# Patient Record
Sex: Female | Born: 1935 | Race: White | Hispanic: No | Smoking: Current every day smoker
Health system: Southern US, Community
[De-identification: ages and names within clinical notes are randomized; demographics above are authoritative.]

## PROBLEM LIST (undated history)

## (undated) DIAGNOSIS — E079 Disorder of thyroid, unspecified: Secondary | ICD-10-CM

## (undated) DIAGNOSIS — J449 Chronic obstructive pulmonary disease, unspecified: Secondary | ICD-10-CM

## (undated) DIAGNOSIS — J40 Bronchitis, not specified as acute or chronic: Secondary | ICD-10-CM

## (undated) DIAGNOSIS — R159 Full incontinence of feces: Secondary | ICD-10-CM

## (undated) DIAGNOSIS — W19XXXA Unspecified fall, initial encounter: Secondary | ICD-10-CM

## (undated) DIAGNOSIS — R5383 Other fatigue: Secondary | ICD-10-CM

## (undated) DIAGNOSIS — F419 Anxiety disorder, unspecified: Secondary | ICD-10-CM

## (undated) DIAGNOSIS — I10 Essential (primary) hypertension: Secondary | ICD-10-CM

## (undated) HISTORY — PX: VARICOSE VEIN SURGERY: SHX832

## (undated) HISTORY — DX: Chronic obstructive pulmonary disease, unspecified: J44.9

## (undated) HISTORY — DX: Full incontinence of feces: R15.9

## (undated) HISTORY — DX: Bronchitis, not specified as acute or chronic: J40

## (undated) HISTORY — DX: Unspecified fall, initial encounter: W19.XXXA

## (undated) HISTORY — PX: CHOLECYSTECTOMY: SHX55

## (undated) HISTORY — PX: VAGINAL HYSTERECTOMY: SUR661

## (undated) HISTORY — PX: FOOT SURGERY: SHX648

## (undated) HISTORY — PX: APPENDECTOMY: SHX54

## (undated) HISTORY — DX: Other fatigue: R53.83

## (undated) HISTORY — PX: DILATION AND CURETTAGE OF UTERUS: SHX78

## (undated) HISTORY — DX: Anxiety disorder, unspecified: F41.9

## (undated) HISTORY — DX: Disorder of thyroid, unspecified: E07.9

## (undated) HISTORY — DX: Essential (primary) hypertension: I10

---

## 2005-02-18 ENCOUNTER — Ambulatory Visit: Payer: Self-pay | Admitting: Family Medicine

## 2005-05-27 ENCOUNTER — Ambulatory Visit: Payer: Self-pay | Admitting: Gastroenterology

## 2006-02-19 ENCOUNTER — Ambulatory Visit: Payer: Self-pay | Admitting: Family Medicine

## 2006-08-17 ENCOUNTER — Ambulatory Visit: Payer: Self-pay | Admitting: Unknown Physician Specialty

## 2006-08-17 ENCOUNTER — Other Ambulatory Visit: Payer: Self-pay

## 2006-08-20 ENCOUNTER — Inpatient Hospital Stay: Payer: Self-pay | Admitting: Unknown Physician Specialty

## 2007-02-20 ENCOUNTER — Emergency Department: Payer: Self-pay

## 2007-04-29 ENCOUNTER — Ambulatory Visit: Payer: Self-pay | Admitting: Family Medicine

## 2008-03-14 ENCOUNTER — Inpatient Hospital Stay: Payer: Self-pay | Admitting: Internal Medicine

## 2008-03-26 ENCOUNTER — Ambulatory Visit: Payer: Self-pay | Admitting: Family Medicine

## 2008-05-09 ENCOUNTER — Ambulatory Visit: Payer: Self-pay | Admitting: Family Medicine

## 2008-06-21 ENCOUNTER — Ambulatory Visit: Payer: Self-pay | Admitting: Ophthalmology

## 2008-07-04 ENCOUNTER — Ambulatory Visit: Payer: Self-pay | Admitting: Ophthalmology

## 2008-08-24 ENCOUNTER — Ambulatory Visit: Payer: Self-pay | Admitting: Ophthalmology

## 2008-09-05 ENCOUNTER — Ambulatory Visit: Payer: Self-pay | Admitting: Ophthalmology

## 2008-12-15 ENCOUNTER — Emergency Department: Payer: Self-pay | Admitting: Emergency Medicine

## 2009-02-09 ENCOUNTER — Inpatient Hospital Stay: Payer: Self-pay | Admitting: Internal Medicine

## 2009-03-27 ENCOUNTER — Ambulatory Visit: Payer: Self-pay | Admitting: Internal Medicine

## 2009-04-17 ENCOUNTER — Ambulatory Visit: Payer: Self-pay | Admitting: Unknown Physician Specialty

## 2009-04-19 ENCOUNTER — Emergency Department: Payer: Self-pay | Admitting: Emergency Medicine

## 2009-06-12 ENCOUNTER — Inpatient Hospital Stay: Payer: Self-pay | Admitting: Internal Medicine

## 2009-08-09 ENCOUNTER — Inpatient Hospital Stay: Payer: Self-pay | Admitting: Internal Medicine

## 2009-10-08 ENCOUNTER — Emergency Department: Payer: Self-pay | Admitting: Internal Medicine

## 2010-05-05 ENCOUNTER — Emergency Department: Payer: Self-pay | Admitting: Internal Medicine

## 2010-10-04 ENCOUNTER — Ambulatory Visit: Payer: Self-pay | Admitting: Internal Medicine

## 2010-10-08 ENCOUNTER — Ambulatory Visit: Payer: Self-pay | Admitting: Internal Medicine

## 2010-10-11 ENCOUNTER — Ambulatory Visit: Payer: Self-pay | Admitting: Internal Medicine

## 2010-10-12 ENCOUNTER — Ambulatory Visit: Payer: Self-pay | Admitting: Internal Medicine

## 2010-11-10 ENCOUNTER — Ambulatory Visit: Payer: Self-pay | Admitting: Internal Medicine

## 2011-01-02 ENCOUNTER — Other Ambulatory Visit: Payer: Self-pay | Admitting: *Deleted

## 2011-01-02 MED ORDER — VITAMIN D 1000 UNITS PO TABS: 1000.0000 [IU] | ORAL_TABLET | Freq: Every day | ORAL | Status: DC

## 2011-01-14 ENCOUNTER — Emergency Department: Payer: Self-pay | Admitting: Emergency Medicine

## 2011-01-15 ENCOUNTER — Telehealth: Payer: Self-pay | Admitting: Internal Medicine

## 2011-01-15 NOTE — Telephone Encounter (Signed)
It is fine for [place] to set up [facility].

## 2011-01-15 NOTE — Telephone Encounter (Signed)
Ms. Brisbon is coming in tomorrow.

## 2011-01-15 NOTE — Telephone Encounter (Signed)
Patient's daughter called and stated that Mrs. Bickhart fell and broke her shoulder and would like to have a nurse come out 2-3 times a week because she can't do anything for herself.  She would like [facility].  Please advsie.

## 2011-01-16 ENCOUNTER — Encounter: Payer: Self-pay | Admitting: Internal Medicine

## 2011-01-16 ENCOUNTER — Emergency Department (HOSPITAL_COMMUNITY)
Admission: EM | Admit: 2011-01-16 | Discharge: 2011-01-16 | Discharge status: Against Medical Advice | Payer: Medicare Other | Attending: Emergency Medicine | Admitting: Emergency Medicine

## 2011-01-16 ENCOUNTER — Ambulatory Visit (INDEPENDENT_AMBULATORY_CARE_PROVIDER_SITE_OTHER): Payer: BC Managed Care – PPO | Admitting: Internal Medicine

## 2011-01-16 ENCOUNTER — Emergency Department: Payer: Self-pay | Admitting: Emergency Medicine

## 2011-01-16 VITALS — BP 145/70 | HR 92 | Temp 98.4°F | Resp 20 | Ht 62.0 in | Wt 114.0 lb

## 2011-01-16 DIAGNOSIS — J4489 Other specified chronic obstructive pulmonary disease: Secondary | ICD-10-CM

## 2011-01-16 DIAGNOSIS — M79609 Pain in unspecified limb: Secondary | ICD-10-CM

## 2011-01-16 DIAGNOSIS — S42309A Unspecified fracture of shaft of humerus, unspecified arm, initial encounter for closed fracture: Secondary | ICD-10-CM

## 2011-01-16 DIAGNOSIS — T148XXA Other injury of unspecified body region, initial encounter: Secondary | ICD-10-CM

## 2011-01-16 DIAGNOSIS — M79602 Pain in left arm: Secondary | ICD-10-CM

## 2011-01-16 DIAGNOSIS — J449 Chronic obstructive pulmonary disease, unspecified: Secondary | ICD-10-CM | POA: Insufficient documentation

## 2011-01-16 DIAGNOSIS — IMO0002 Reserved for concepts with insufficient information to code with codable children: Secondary | ICD-10-CM

## 2011-01-16 DIAGNOSIS — Z0389 Encounter for observation for other suspected diseases and conditions ruled out: Secondary | ICD-10-CM | POA: Insufficient documentation

## 2011-01-16 NOTE — Progress Notes (Signed)
Subjective:    Patient ID: Michele Hayes, female    DOB: 1925/06/24, 77 y.o.   MRN: [id number]  HPI Ms. Timberman is an 77 year old female with a history of COPD and osteoporosis who presents for an acute visit after a recent fall 2 days ago. She reportedly fell while walking into a restaurant. She fell on her left upper arm. She was evaluated in the emergency department and noted to have a fracture of her left humerus. She also had extensive skin tearing on the left upper arm. She was ultimately discharged home from the ER with plans for followup with orthopedics tomorrow. Her daughters report that he has been unable to change her gauze bandage because of severe left upper arm pain.  Outpatient Encounter Prescriptions as of 01/16/2011  Medication Sig Dispense Refill  . Albuterol Sulfate (VENTOLIN HFA IN) Inhale 2 puffs into the lungs 2 (two) times daily.        Marland Kitchen aspirin 81 MG tablet Take 81 mg by mouth daily.        . cefadroxil (DURICEF) 500 MG capsule       . cholecalciferol (VITAMIN D) 1000 UNITS tablet Take 1 tablet (1,000 Units total) by mouth daily.  30 tablet  3  . diltiazem (CARDIZEM CD) 120 MG 24 hr capsule       . FERROUS SULFATE PO Take 1 tablet by mouth daily.        Marland Kitchen levothyroxine (SYNTHROID, LEVOTHROID) 50 MCG tablet       . oxyCODONE-acetaminophen (PERCOCET) 5-325 MG per tablet       . PARoxetine (PAXIL) 20 MG tablet       . PERFOROMIST 20 MCG/2ML nebulizer solution       . PULMICORT 1 MG/2ML nebulizer solution         Review of Systems  Respiratory: Positive for shortness of breath (chronic).   Cardiovascular: Negative for chest pain.  Musculoskeletal: Positive for myalgias, joint swelling and arthralgias.  Skin: Positive for wound.   BP 145/70  Pulse 92  Temp(Src) 98.4 F (36.9 C) (Oral)  Resp 20  Ht 5\' 2"  (1.575 m)  Wt 114 lb (51.71 kg)  BMI 20.85 kg/m2  SpO2 93%     Objective:   Physical Exam  Constitutional: She is oriented to person, place, and time. She appears  well-developed and well-nourished. No distress.  HENT:  Head: Normocephalic and atraumatic.  Right Ear: External ear normal.  Left Ear: External ear normal.  Nose: Nose normal.  Mouth/Throat: Oropharynx is clear and moist.  Eyes: EOM are normal. Pupils are equal, round, and reactive to light.  Neck: Normal range of motion.  Pulmonary/Chest: Effort normal.  Musculoskeletal:       Left shoulder: She exhibits decreased range of motion, tenderness, bony tenderness, swelling, deformity, pain and decreased strength.       Arms: Neurological: She is alert and oriented to person, place, and time.  Skin: Skin is warm and dry. No rash noted. She is not diaphoretic. There is erythema.  Psychiatric: She has a normal mood and affect. Her behavior is normal. Judgment and thought content normal.          Assessment & Plan:  1. Left humerus fracture - patient has a fracture of her left humerus after a fall from standing position. She was evaluated in the emergency department and given a sling with plans for orthopedic followup tomorrow. She has been having severe pain which is not alleviated with Percocet. Her daughters have  been unable to change the bandage over her skin tear on the left upper arm. Today I am unable to remove the bandage because of her severe pain. We will plan to send her to the [facility] emergency department. I have spoken to the triage nurse there. She will likely need admission for IV narcotics for pain control. She will also need orthopedic eval and wound consult for the extensive skin tear on her left upper arm.

## 2011-01-17 ENCOUNTER — Telehealth: Payer: Self-pay | Admitting: Internal Medicine

## 2011-01-17 NOTE — Telephone Encounter (Signed)
Daughter wants [facility] to come in 1 a day to help with her mom

## 2011-01-17 NOTE — Telephone Encounter (Signed)
I did call ahead to the ED.  Did she follow up with orthopedics today?  Yes, [facility] help with bandage change.

## 2011-01-17 NOTE — Telephone Encounter (Signed)
I spoke with Lennox Laity patients daughter and she stated that they went to [facility] and wait was too long so they left and went to [facility] and they changed her bandage.  She wants to know if we can get [facility] to come out once a  day

## 2011-02-20 ENCOUNTER — Other Ambulatory Visit: Payer: Self-pay | Admitting: *Deleted

## 2011-02-20 MED ORDER — PAROXETINE HCL 20 MG PO TABS: 20.0000 mg | ORAL_TABLET | ORAL | Status: DC

## 2011-02-20 MED ORDER — LEVOTHYROXINE SODIUM 50 MCG PO TABS: 50.0000 ug | ORAL_TABLET | Freq: Every day | ORAL | Status: DC

## 2011-04-01 ENCOUNTER — Telehealth: Payer: Self-pay | Admitting: *Deleted

## 2011-04-01 MED ORDER — DILTIAZEM HCL ER COATED BEADS 120 MG PO CP24: 120.0000 mg | ORAL_CAPSULE | Freq: Every day | ORAL | Status: DC

## 2011-04-01 NOTE — Telephone Encounter (Signed)
Done

## 2011-04-08 ENCOUNTER — Inpatient Hospital Stay: Payer: Self-pay | Admitting: *Deleted

## 2011-04-09 DIAGNOSIS — R000 Tachycardia, unspecified: Secondary | ICD-10-CM

## 2011-04-14 ENCOUNTER — Telehealth: Payer: Self-pay | Admitting: *Deleted

## 2011-04-14 NOTE — Telephone Encounter (Signed)
Keri w/[facility] called - pt was d/c'd from hospital w/no orders to resume home health services. She is requesting fax form to resume care. Form will be faxed in today to resume home health services.

## 2011-04-17 ENCOUNTER — Ambulatory Visit (INDEPENDENT_AMBULATORY_CARE_PROVIDER_SITE_OTHER): Payer: Medicare Other | Admitting: Internal Medicine

## 2011-04-17 ENCOUNTER — Encounter: Payer: Self-pay | Admitting: Internal Medicine

## 2011-04-17 VITALS — BP 100/40 | HR 62 | Temp 97.8°F | Wt 111.0 lb

## 2011-04-17 DIAGNOSIS — R269 Unspecified abnormalities of gait and mobility: Secondary | ICD-10-CM

## 2011-04-17 DIAGNOSIS — J449 Chronic obstructive pulmonary disease, unspecified: Secondary | ICD-10-CM

## 2011-04-17 DIAGNOSIS — I509 Heart failure, unspecified: Secondary | ICD-10-CM

## 2011-04-17 DIAGNOSIS — R2681 Unsteadiness on feet: Secondary | ICD-10-CM

## 2011-04-17 NOTE — Progress Notes (Signed)
Subjective:    Patient ID: Michele Hayes, female    DOB: 12/20/1925, 77 y.o.   MRN: [id number]  HPI 77 year old female with a history of COPD presents for followup after recent hospitalization for pneumonia. She reports that she is feeling well. She reports that she has completed both her antibiotics and her steroids. She has some mild shortness of breath on exertion and chronic cough. However, she reports she is feeling much better overall.  She notes that she was evaluated in the hospital by her cardiologist and found to have some weakening of her heart muscle. She notes that she is scheduled for a stress test. She notes that she is unable to perform a treadmill stress test because of her gait instability. She notes numerous recent falls at home including a fall which resulted in fracture of her left humerus.  Outpatient Encounter Prescriptions as of 04/17/2011  Medication Sig Dispense Refill  . Albuterol Sulfate (VENTOLIN HFA IN) Inhale 2 puffs into the lungs 2 (two) times daily.       Marland Kitchen aspirin 81 MG tablet Take 81 mg by mouth daily.        . cholecalciferol (VITAMIN D) 1000 UNITS tablet Take 1 tablet (1,000 Units total) by mouth daily.  30 tablet  3  . diltiazem (CARDIZEM CD) 120 MG 24 hr capsule Take 180 mg by mouth daily.        Marland Kitchen FERROUS SULFATE PO Take 1 tablet by mouth daily.        Marland Kitchen levothyroxine (SYNTHROID, LEVOTHROID) 50 MCG tablet Take 1 tablet (50 mcg total) by mouth daily.  30 tablet  6  . lisinopril (PRINIVIL,ZESTRIL) 5 MG tablet Take 5 mg by mouth daily.        Marland Kitchen PARoxetine (PAXIL) 20 MG tablet Take 1 tablet (20 mg total) by mouth every morning.  30 tablet  4  . PERFOROMIST 20 MCG/2ML nebulizer solution 20 mcg 2 (two) times daily.       Marland Kitchen PULMICORT 1 MG/2ML nebulizer solution Take 1 mg by nebulization 2 (two) times daily as needed.         Review of Systems  Constitutional: Negative for fever, chills, appetite change, fatigue and unexpected weight change.  HENT: Negative for  congestion and neck pain.   Eyes: Negative for visual disturbance.  Respiratory: Positive for cough and shortness of breath. Negative for wheezing and stridor.   Cardiovascular: Negative for chest pain, palpitations and leg swelling.  Gastrointestinal: Negative for abdominal pain, diarrhea, constipation and abdominal distention.  Genitourinary: Negative for dysuria and flank pain.  Musculoskeletal: Positive for myalgias, arthralgias and gait problem.  Skin: Negative for color change and rash.  Neurological: Negative for dizziness and headaches.  Hematological: Negative for adenopathy. Does not bruise/bleed easily.  Psychiatric/Behavioral: Negative for suicidal ideas, sleep disturbance and dysphoric mood. The patient is not nervous/anxious.    BP 100/40  Pulse 62  Temp(Src) 97.8 F (36.6 C) (Oral)  Wt 111 lb (50.349 kg)  SpO2 90%     Objective:   Physical Exam  Constitutional: She is oriented to person, place, and time. She appears well-developed and well-nourished. No distress.  HENT:  Head: Normocephalic and atraumatic.  Right Ear: External ear normal.  Left Ear: External ear normal.  Nose: Nose normal.  Mouth/Throat: Oropharynx is clear and moist. No oropharyngeal exudate.  Eyes: Conjunctivae are normal. Pupils are equal, round, and reactive to light. Right eye exhibits no discharge. Left eye exhibits no discharge. No scleral icterus.  Neck: Normal range of motion. Neck supple. No tracheal deviation present. No thyromegaly present.  Cardiovascular: Normal rate, regular rhythm, normal heart sounds and intact distal pulses.  Exam reveals no gallop and no friction rub.   No murmur heard. Pulmonary/Chest: Effort normal and breath sounds normal. No accessory muscle usage. Not tachypneic. No respiratory distress. She has no decreased breath sounds. She has no wheezes. She has no rhonchi. She has no rales. She exhibits no tenderness.  Musculoskeletal: Normal range of motion. She exhibits  no edema and no tenderness.  Lymphadenopathy:    She has no cervical adenopathy.  Neurological: She is alert and oriented to person, place, and time. No cranial nerve deficit. She exhibits normal muscle tone. Coordination normal.  Skin: Skin is warm and dry. No rash noted. She is not diaphoretic. No erythema. No pallor.     Psychiatric: She has a normal mood and affect. Her behavior is normal. Judgment and thought content normal.          Assessment & Plan:  1. COPD -her lung exam is the best I've heard it in the recent past. She has good air movement and no rhonchi noted. Will continue to monitor. Will continue her current medications including bronchodilators and inhaled steroids. She will return to clinic in one month.  2. Congestive heart failure - will get records on recent hospitalization and cardiac echo. Will discuss with her cardiologist using a dobutamine stress test as opposed to treadmill test.  3. Gait instability -will prescription today for a walker to help prevent falls in the home. Patient will pick this up at her pharmacy.

## 2011-04-17 NOTE — Patient Instructions (Signed)
Vanicream lotion

## 2011-04-18 ENCOUNTER — Other Ambulatory Visit: Payer: Self-pay | Admitting: *Deleted

## 2011-04-18 MED ORDER — ROLLER WALKER MISC: Status: DC

## 2011-04-25 ENCOUNTER — Telehealth: Payer: Self-pay | Admitting: *Deleted

## 2011-04-25 NOTE — Telephone Encounter (Signed)
Pharm faxed RF request for potassium 10 meq 1 qd and furosemide 20 mg 1 qd. OK for rf? I do not see on med list.

## 2011-04-25 NOTE — Telephone Encounter (Signed)
She hasn't used this recently. We can refill, though, she uses when having edema.

## 2011-04-28 MED ORDER — FUROSEMIDE 20 MG PO TABS: 20.0000 mg | ORAL_TABLET | Freq: Every day | ORAL | Status: DC | PRN

## 2011-04-28 MED ORDER — POTASSIUM CHLORIDE ER 10 MEQ PO TBCR: 10.0000 meq | EXTENDED_RELEASE_TABLET | Freq: Every day | ORAL | Status: DC | PRN

## 2011-04-28 NOTE — Telephone Encounter (Signed)
RX sent in

## 2011-05-12 ENCOUNTER — Other Ambulatory Visit: Payer: Self-pay | Admitting: *Deleted

## 2011-05-12 MED ORDER — VITAMIN D 1000 UNITS PO TABS: 1000.0000 [IU] | ORAL_TABLET | Freq: Every day | ORAL | Status: DC

## 2011-05-13 LAB — CK TOTAL AND CKMB (NOT AT ARMC)
CK, Total: 33 U/L (ref 21–215)
CK-MB: 1.1 ng/mL (ref 0.5–3.6)

## 2011-05-13 LAB — COMPREHENSIVE METABOLIC PANEL
Albumin: 3.2 g/dL — ABNORMAL LOW (ref 3.4–5.0)
Alkaline Phosphatase: 92 U/L (ref 50–136)
Anion Gap: 8 (ref 7–16)
BUN: 20 mg/dL — ABNORMAL HIGH (ref 7–18)
Bilirubin,Total: 0.3 mg/dL (ref 0.2–1.0)
Creatinine: 0.7 mg/dL (ref 0.60–1.30)
EGFR (Non-African Amer.): >60
Glucose: 140 mg/dL — ABNORMAL HIGH (ref 65–99)
Osmolality: 266 (ref 275–301)
SGOT(AST): 22 U/L (ref 15–37)
Sodium: 130 mmol/L — ABNORMAL LOW (ref 136–145)
Total Protein: 7.5 g/dL (ref 6.4–8.2)

## 2011-05-13 LAB — CBC
HGB: 12.6 g/dL (ref 12.0–16.0)
MCH: 29.1 pg (ref 26.0–34.0)
MCHC: 32 g/dL (ref 32.0–36.0)
Platelet: 282 10*3/uL (ref 150–440)

## 2011-05-13 LAB — PRO B NATRIURETIC PEPTIDE: B-Type Natriuretic Peptide: 1860 pg/mL — ABNORMAL HIGH (ref 0–450)

## 2011-05-14 ENCOUNTER — Inpatient Hospital Stay: Payer: Self-pay | Admitting: Internal Medicine

## 2011-05-14 DIAGNOSIS — R0602 Shortness of breath: Secondary | ICD-10-CM

## 2011-05-14 DIAGNOSIS — R05 Cough: Secondary | ICD-10-CM

## 2011-05-14 DIAGNOSIS — R059 Cough, unspecified: Secondary | ICD-10-CM

## 2011-05-14 DIAGNOSIS — I369 Nonrheumatic tricuspid valve disorder, unspecified: Secondary | ICD-10-CM

## 2011-05-14 LAB — URINALYSIS, COMPLETE
Ketone: NEGATIVE
Ph: 6 (ref 4.5–8.0)
Protein: NEGATIVE
RBC,UR: 22 /HPF (ref 0–5)
WBC UR: 35 /HPF (ref 0–5)

## 2011-05-14 LAB — TROPONIN I
Troponin-I: <0.02 ng/mL
Troponin-I: <0.02 ng/mL

## 2011-05-14 LAB — CK TOTAL AND CKMB (NOT AT ARMC): CK, Total: 26 U/L (ref 21–215)

## 2011-05-15 DIAGNOSIS — I499 Cardiac arrhythmia, unspecified: Secondary | ICD-10-CM

## 2011-05-16 LAB — BASIC METABOLIC PANEL
Anion Gap: 7 (ref 7–16)
BUN: 29 mg/dL — ABNORMAL HIGH (ref 7–18)
Calcium, Total: 8.3 mg/dL — ABNORMAL LOW (ref 8.5–10.1)
EGFR (African American): >60
EGFR (Non-African Amer.): >60
Glucose: 149 mg/dL — ABNORMAL HIGH (ref 65–99)
Osmolality: 284 (ref 275–301)

## 2011-05-20 ENCOUNTER — Other Ambulatory Visit: Payer: Self-pay | Admitting: *Deleted

## 2011-05-20 ENCOUNTER — Telehealth: Payer: Self-pay | Admitting: *Deleted

## 2011-05-20 MED ORDER — BUDESONIDE 1 MG/2ML IN SUSP: 1.0000 mg | Freq: Two times a day (BID) | RESPIRATORY_TRACT | Status: DC | PRN

## 2011-05-20 NOTE — Telephone Encounter (Signed)
I spoke with Home health RN - she feels that patient is doing "ok" aside from her weakness. She does not notice the "thick" tongue or slurred speech that family reports. Lopressor 25 mg bid and Xopenex were added when pt was d/c'd. RN asked pharm to provide pt w/incentive spirometer also.   Family continues to be concerned and would like patient to be seen by MD tomorrow PM if possible.

## 2011-05-20 NOTE — Telephone Encounter (Signed)
I spoke w/daughter, Lennox Laity who just came back into town and is concerned. She wants pt seen sooner than Monday. Pt was d/c'd from hospital Friday, dx PNA. Daughter states that patient has had slurred speech and is extremely fatigued since being discharged. NO confusion, weakness on one side, facial asymmetry etc. I called the home health RN and she will make a visit today and call me back w/update.

## 2011-05-20 NOTE — Telephone Encounter (Signed)
We can try to see her tomorrow.

## 2011-05-21 NOTE — Telephone Encounter (Signed)
Pt daughter jody called.  Pt stated that she was feeling better today

## 2011-05-21 NOTE — Telephone Encounter (Signed)
I don't understand why pulmicort would be so expensive? There must be an inhaled steroid on her formulary? Will her insurance cover Advair or Spiriva?

## 2011-05-21 NOTE — Telephone Encounter (Signed)
I spoke w/Jodi. She will keep f/u Monday. Pt's insurance will not cover pulmicort, cost is 1200$/mth. Pharm recommends albuterol along with the ipratropium in neb. She would like to get this soon if possible.   1. OK to d/c pulmicort and add albuterol & ipratropium q 6 prn via neb?   2. Pt was d/c'd from hospital on lopressor bid but she has only been taking med qd. BP yesterday was 100/60 p 53. RN expressed concerns that these was borderline low. Daughter will keep lopressor at 1 qd until OV Monday. Rn will make another visit tomorrow.

## 2011-05-21 NOTE — Telephone Encounter (Signed)
She is already on spirivia. Daughter is concerned about stopping pulmicort and pharm advised albuterol. Should she see pulmonary?

## 2011-05-22 NOTE — Telephone Encounter (Signed)
These medications are used for different purposes.  Spiriva is long acting. Albuterol is short acting for rescue.  I can discuss with them at their visit.

## 2011-05-23 NOTE — Telephone Encounter (Signed)
Daughter informed

## 2011-05-26 ENCOUNTER — Ambulatory Visit (INDEPENDENT_AMBULATORY_CARE_PROVIDER_SITE_OTHER): Payer: Medicare Other | Admitting: Internal Medicine

## 2011-05-26 ENCOUNTER — Encounter: Payer: Self-pay | Admitting: Internal Medicine

## 2011-05-26 VITALS — BP 118/52 | HR 65 | Temp 97.7°F | Wt 106.0 lb

## 2011-05-26 DIAGNOSIS — J449 Chronic obstructive pulmonary disease, unspecified: Secondary | ICD-10-CM

## 2011-05-26 MED ORDER — ALBUTEROL SULFATE (2.5 MG/3ML) 0.083% IN NEBU: 2.5000 mg | INHALATION_SOLUTION | Freq: Four times a day (QID) | RESPIRATORY_TRACT | Status: DC | PRN

## 2011-05-26 MED ORDER — FLUTICASONE-SALMETEROL 250-50 MCG/DOSE IN AEPB: 1.0000 | INHALATION_SPRAY | Freq: Two times a day (BID) | RESPIRATORY_TRACT | Status: DC

## 2011-05-26 NOTE — Progress Notes (Signed)
Subjective:    Patient ID: Michele Hayes, female    DOB: 08/12/1925, 78 y.o.   MRN: [id number]  HPI 78 year old female with COPD presents for followup after recent hospitalization for COPD exacerbation. She reports that she's been feeling well. She denies any increased cough or sputum production. She denies any fever or chills. She denies any shortness of breath greater than her baseline. She has some questions today about her medications. She has been using Perforomist and Pulmicort at home in addition to using Symbicort. She needs further clarification as to what medications to use.  Outpatient Encounter Prescriptions as of 05/26/2011  Medication Sig Dispense Refill  . aspirin 81 MG tablet Take 81 mg by mouth daily.        . cholecalciferol (VITAMIN D) 1000 UNITS tablet Take 1 tablet (1,000 Units total) by mouth daily.  30 tablet  3  . diltiazem (DILACOR XR) 180 MG 24 hr capsule       . FERROUS SULFATE PO Take 1 tablet by mouth daily.        . furosemide (LASIX) 20 MG tablet Take 1 tablet (20 mg total) by mouth daily as needed (Swelling).  30 tablet  1  . levothyroxine (SYNTHROID, LEVOTHROID) 50 MCG tablet Take 1 tablet (50 mcg total) by mouth daily.  30 tablet  6  . metoprolol tartrate (LOPRESSOR) 25 MG tablet Take 25 mg by mouth daily.      Marland Kitchen PARoxetine (PAXIL) 20 MG tablet Take 1 tablet (20 mg total) by mouth every morning.  30 tablet  4  . potassium chloride (K-DUR) 10 MEQ tablet Take 1 tablet (10 mEq total) by mouth daily as needed. Take w/furosemide  30 tablet  1  . SPIRIVA HANDIHALER 18 MCG inhalation capsule       . DISCONTD: PERFOROMIST 20 MCG/2ML nebulizer solution 20 mcg 2 (two) times daily.       Marland Kitchen albuterol (PROVENTIL) (2.5 MG/3ML) 0.083% nebulizer solution Take 3 mLs (2.5 mg total) by nebulization every 6 (six) hours as needed for wheezing.  150 mL  1  . ipratropium (ATROVENT) 0.02 % nebulizer solution Take 500 mcg by nebulization every 6 (six) hours as needed.      Marland Kitchen lisinopril  (PRINIVIL,ZESTRIL) 5 MG tablet Take 5 mg by mouth daily.        . SYMBICORT 160-4.5 MCG/ACT inhaler         Review of Systems  Constitutional: Negative for fever, chills and fatigue.  Respiratory: Positive for cough and shortness of breath. Negative for wheezing.   Cardiovascular: Negative for chest pain.   BP 118/52  Pulse 65  Temp(Src) 97.7 F (36.5 C) (Oral)  Wt 106 lb (48.081 kg)  SpO2 93%     Objective:   Physical Exam  Constitutional: She is oriented to person, place, and time. She appears well-developed and well-nourished. No distress.  HENT:  Head: Normocephalic and atraumatic.  Right Ear: External ear normal.  Left Ear: External ear normal.  Nose: Nose normal.  Mouth/Throat: Oropharynx is clear and moist. No oropharyngeal exudate.  Eyes: Conjunctivae are normal. Pupils are equal, round, and reactive to light. Right eye exhibits no discharge. Left eye exhibits no discharge. No scleral icterus.  Neck: Normal range of motion. Neck supple. No tracheal deviation present. No thyromegaly present.  Cardiovascular: Normal rate, regular rhythm, normal heart sounds and intact distal pulses.  Exam reveals no gallop and no friction rub.   No murmur heard. Pulmonary/Chest: Effort normal. No respiratory distress. She  has decreased breath sounds (prolonged expiration throughout). She has no wheezes. She has rhonchi (right base, minimal). She has no rales. She exhibits no tenderness.  Musculoskeletal: Normal range of motion. She exhibits no edema and no tenderness.  Lymphadenopathy:    She has no cervical adenopathy.  Neurological: She is alert and oriented to person, place, and time. No cranial nerve deficit. She exhibits normal muscle tone. Coordination normal.  Skin: Skin is warm and dry. No rash noted. She is not diaphoretic. No erythema. No pallor.  Psychiatric: She has a normal mood and affect. Her behavior is normal. Judgment and thought content normal.          Assessment &  Plan:  1. COPD - s/p hospitalization for recent exacerbation. Pt and family confused about medication instructions. Will continue Symbicort and prn albuterol. Follow up in 1 month.

## 2011-05-30 ENCOUNTER — Encounter: Payer: Self-pay | Admitting: Internal Medicine

## 2011-05-30 ENCOUNTER — Encounter: Payer: Self-pay | Admitting: *Deleted

## 2011-06-04 ENCOUNTER — Telehealth: Payer: Self-pay | Admitting: Internal Medicine

## 2011-06-04 NOTE — Telephone Encounter (Signed)
Spoke w/RN, she made visit to pt yesterday. SOB has improved w/lasix and swelling is in lower legs & ankles. Pt can not see the scale and does not take daily weight. Also does not elevated legs/feet often. RN will make another visit to pt tomorrow and call me w/update.

## 2011-06-04 NOTE — Telephone Encounter (Signed)
Having increased edema in her lower legs has taken lasix 20 mg for 2 days. Call back caller nurseErie Noe) [phone].

## 2011-06-30 ENCOUNTER — Ambulatory Visit (INDEPENDENT_AMBULATORY_CARE_PROVIDER_SITE_OTHER): Payer: Medicare Other | Admitting: Internal Medicine

## 2011-06-30 ENCOUNTER — Encounter: Payer: Self-pay | Admitting: Internal Medicine

## 2011-06-30 DIAGNOSIS — J449 Chronic obstructive pulmonary disease, unspecified: Secondary | ICD-10-CM

## 2011-06-30 DIAGNOSIS — R230 Cyanosis: Secondary | ICD-10-CM | POA: Insufficient documentation

## 2011-06-30 DIAGNOSIS — R609 Edema, unspecified: Secondary | ICD-10-CM

## 2011-06-30 LAB — COMPREHENSIVE METABOLIC PANEL
ALT: 10 U/L (ref 0–35)
CO2: 37 mEq/L — ABNORMAL HIGH (ref 19–32)
Chloride: 97 mEq/L (ref 96–112)
GFR: 94 mL/min (ref >60.00)
Potassium: 4.4 mEq/L (ref 3.5–5.1)
Sodium: 139 mEq/L (ref 135–145)
Total Bilirubin: 0.7 mg/dL (ref 0.3–1.2)
Total Protein: 6.7 g/dL (ref 6.0–8.3)

## 2011-06-30 NOTE — Assessment & Plan Note (Signed)
Symptoms currently well-controlled with inhaled bronchodilators and steroids. Will plan to continue. We'll have her followup in one month.

## 2011-06-30 NOTE — Assessment & Plan Note (Addendum)
Chronic. Will get ABIs and arterial Dopplers with vascular. Followup in one month.

## 2011-06-30 NOTE — Assessment & Plan Note (Signed)
Minimal edema noted on exam today. Suspect secondary to chronic venous hypertension and congestive heart failure. We'll plan to continue to use Lasix as needed, with a goal of every other day. Encouraged her to keep her legs elevated and to use compression stockings as tolerated. We'll check renal function with labs today. Followup one month.

## 2011-06-30 NOTE — Progress Notes (Signed)
Subjective:    Patient ID: Michele Hayes, female    DOB: 15-Feb-1926, 78 y.o.   MRN: [id number]  HPI 78 year old female with history of COPD, CHF presents for followup. Her primary concern today is persistent bilateral lower extremity edema. She has been taking Lasix every day or every other day with improvement in the edema. She has not been wearing compression stockings. She finds it difficult to keep her legs elevated and has not been doing this on a regular basis. She denies shortness of breath above her baseline today. She reports full compliance with her inhaled bronchodilators and steroids. She continues to have occasional cough productive of yellow sputum which is at her baseline.  Outpatient Encounter Prescriptions as of 06/30/2011  Medication Sig Dispense Refill  . albuterol (PROVENTIL) (2.5 MG/3ML) 0.083% nebulizer solution Take 3 mLs (2.5 mg total) by nebulization every 6 (six) hours as needed for wheezing.  150 mL  1  . aspirin 81 MG tablet Take 81 mg by mouth daily.        . cholecalciferol (VITAMIN D) 1000 UNITS tablet Take 1 tablet (1,000 Units total) by mouth daily.  30 tablet  3  . diltiazem (DILACOR XR) 180 MG 24 hr capsule       . FERROUS SULFATE PO Take 1 tablet by mouth daily.        . furosemide (LASIX) 20 MG tablet Take 1 tablet (20 mg total) by mouth daily as needed (Swelling).  30 tablet  1  . ipratropium (ATROVENT) 0.02 % nebulizer solution Take 500 mcg by nebulization every 6 (six) hours as needed.      Marland Kitchen levothyroxine (SYNTHROID, LEVOTHROID) 50 MCG tablet Take 1 tablet (50 mcg total) by mouth daily.  30 tablet  6  . lisinopril (PRINIVIL,ZESTRIL) 5 MG tablet Take 5 mg by mouth daily.        . metoprolol tartrate (LOPRESSOR) 25 MG tablet Take 25 mg by mouth daily.      Marland Kitchen PARoxetine (PAXIL) 20 MG tablet Take 1 tablet (20 mg total) by mouth every morning.  30 tablet  4  . potassium chloride (K-DUR) 10 MEQ tablet Take 1 tablet (10 mEq total) by mouth daily as needed. Take  w/furosemide  30 tablet  1  . SPIRIVA HANDIHALER 18 MCG inhalation capsule       . SYMBICORT 160-4.5 MCG/ACT inhaler        BP 126/66  Pulse 86  Temp(Src) 97.4 F (36.3 C) (Oral)  Ht 5\' 2"  (1.575 m)  Wt 108 lb (48.988 kg)  BMI 19.75 kg/m2  SpO2 90%  Review of Systems  Constitutional: Negative for fever, chills, appetite change, fatigue and unexpected weight change.  HENT: Negative for ear pain, congestion, sore throat, trouble swallowing, neck pain, voice change and sinus pressure.   Eyes: Negative for visual disturbance.  Respiratory: Positive for cough and shortness of breath. Negative for wheezing and stridor.   Cardiovascular: Positive for leg swelling. Negative for chest pain and palpitations.  Gastrointestinal: Negative for nausea, vomiting, abdominal pain, diarrhea, constipation, blood in stool, abdominal distention and anal bleeding.  Genitourinary: Negative for dysuria and flank pain.  Musculoskeletal: Negative for myalgias, arthralgias and gait problem.  Skin: Positive for color change. Negative for rash.  Neurological: Negative for dizziness and headaches.  Hematological: Negative for adenopathy. Does not bruise/bleed easily.  Psychiatric/Behavioral: Negative for suicidal ideas, sleep disturbance and dysphoric mood. The patient is not nervous/anxious.        Objective:   Physical  Exam  Constitutional: She is oriented to person, place, and time. She appears well-developed and well-nourished. No distress.  HENT:  Head: Normocephalic and atraumatic.  Right Ear: External ear normal.  Left Ear: External ear normal.  Nose: Nose normal.  Mouth/Throat: Oropharynx is clear and moist. No oropharyngeal exudate.  Eyes: Conjunctivae are normal. Pupils are equal, round, and reactive to light. Right eye exhibits no discharge. Left eye exhibits no discharge. No scleral icterus.  Neck: Normal range of motion. Neck supple. No tracheal deviation present. No thyromegaly present.    Cardiovascular: Normal rate, regular rhythm, normal heart sounds and intact distal pulses.  Exam reveals no gallop and no friction rub.   No murmur heard. Pulmonary/Chest: Effort normal. No accessory muscle usage. Not tachypneic. No respiratory distress. She has decreased breath sounds (prolonged expiration). She has wheezes (rare). She has no rales. She exhibits no tenderness.  Musculoskeletal: Normal range of motion. She exhibits no edema and no tenderness.  Lymphadenopathy:    She has no cervical adenopathy.  Neurological: She is alert and oriented to person, place, and time. No cranial nerve deficit. She exhibits normal muscle tone. Coordination normal.  Skin: Skin is warm and dry. No rash noted. She is not diaphoretic. There is erythema (toes). No pallor.  Psychiatric: She has a normal mood and affect. Her behavior is normal. Judgment and thought content normal.          Assessment & Plan:

## 2011-07-16 ENCOUNTER — Telehealth: Payer: Self-pay | Admitting: Internal Medicine

## 2011-07-16 MED ORDER — FERROUS SULFATE 325 (65 FE) MG PO TABS: 325.0000 mg | ORAL_TABLET | Freq: Every day | ORAL | Status: DC

## 2011-07-16 MED ORDER — DILTIAZEM HCL ER 180 MG PO CP24: 180.0000 mg | ORAL_CAPSULE | Freq: Every day | ORAL | Status: DC

## 2011-07-16 NOTE — Telephone Encounter (Signed)
[phone]  jodi called to check on [facility] refill for her diltizem 180mg  1 daily ferrous sulfate 325mg   1 daily  Pt is out of bp meds. 2 days Clydene Pugh mcadams told pt they faxed refill request over yesterday 3/5

## 2011-07-16 NOTE — Telephone Encounter (Signed)
Patient informed. 

## 2011-07-28 ENCOUNTER — Encounter: Payer: Self-pay | Admitting: Internal Medicine

## 2011-07-28 ENCOUNTER — Ambulatory Visit (INDEPENDENT_AMBULATORY_CARE_PROVIDER_SITE_OTHER): Payer: Medicare Other | Admitting: Internal Medicine

## 2011-07-28 VITALS — BP 148/68 | HR 78 | Temp 97.6°F | Wt 112.0 lb

## 2011-07-28 DIAGNOSIS — F32A Depression, unspecified: Secondary | ICD-10-CM

## 2011-07-28 DIAGNOSIS — F329 Major depressive disorder, single episode, unspecified: Secondary | ICD-10-CM

## 2011-07-28 DIAGNOSIS — R609 Edema, unspecified: Secondary | ICD-10-CM

## 2011-07-28 DIAGNOSIS — I10 Essential (primary) hypertension: Secondary | ICD-10-CM

## 2011-07-28 DIAGNOSIS — J441 Chronic obstructive pulmonary disease with (acute) exacerbation: Secondary | ICD-10-CM | POA: Insufficient documentation

## 2011-07-28 DIAGNOSIS — J40 Bronchitis, not specified as acute or chronic: Secondary | ICD-10-CM

## 2011-07-28 MED ORDER — DOXYCYCLINE HYCLATE 100 MG PO TABS: 100.0000 mg | ORAL_TABLET | Freq: Two times a day (BID) | ORAL | Status: AC

## 2011-07-28 MED ORDER — PREDNISONE (PAK) 10 MG PO TABS: ORAL_TABLET | ORAL | Status: AC

## 2011-07-28 MED ORDER — PAROXETINE HCL 20 MG PO TABS: 20.0000 mg | ORAL_TABLET | ORAL | Status: DC

## 2011-07-28 MED ORDER — METOPROLOL TARTRATE 25 MG PO TABS: 25.0000 mg | ORAL_TABLET | Freq: Every day | ORAL | Status: DC

## 2011-07-28 NOTE — Progress Notes (Signed)
Subjective:    Patient ID: Michele Hayes, female    DOB: 1925/09/03, 78 y.o.   MRN: [id number]  HPI 78 year old female with history of COPD presents for followup. She notes that over the last few days she has had increased cough which is productive of purulent sputum. She denies any fever but has had subjective chills. She denies any chest pain. She does have shortness of breath with minimal exertion. She reports full compliance with her inhalers including Symbicort and spiriva. She also uses albuterol as needed.  Aside from this, she reports she is feeling well. She is still awaiting report on ultrasound of her lower extremities which were performed approximately 2 weeks ago. She reports that the edema in her legs is stable.  Outpatient Encounter Prescriptions as of 07/28/2011  Medication Sig Dispense Refill  . albuterol (PROVENTIL) (2.5 MG/3ML) 0.083% nebulizer solution Take 3 mLs (2.5 mg total) by nebulization every 6 (six) hours as needed for wheezing.  150 mL  1  . aspirin 81 MG tablet Take 81 mg by mouth daily.        . cholecalciferol (VITAMIN D) 1000 UNITS tablet Take 1 tablet (1,000 Units total) by mouth daily.  30 tablet  3  . diltiazem (DILACOR XR) 180 MG 24 hr capsule Take 1 capsule (180 mg total) by mouth daily.  90 capsule  1  . ferrous sulfate (FERROUSUL) 325 (65 FE) MG tablet Take 1 tablet (325 mg total) by mouth daily with breakfast.  90 tablet  1  . furosemide (LASIX) 20 MG tablet Take 1 tablet (20 mg total) by mouth daily as needed (Swelling).  30 tablet  1  . ipratropium (ATROVENT) 0.02 % nebulizer solution Take 500 mcg by nebulization every 6 (six) hours as needed.      Marland Kitchen levothyroxine (SYNTHROID, LEVOTHROID) 50 MCG tablet Take 1 tablet (50 mcg total) by mouth daily.  30 tablet  6  . lisinopril (PRINIVIL,ZESTRIL) 5 MG tablet Take 5 mg by mouth daily.        . metoprolol tartrate (LOPRESSOR) 25 MG tablet Take 1 tablet (25 mg total) by mouth daily.  30 tablet  6  . PARoxetine  (PAXIL) 20 MG tablet Take 1 tablet (20 mg total) by mouth every morning.  30 tablet  4  . potassium chloride (K-DUR) 10 MEQ tablet Take 1 tablet (10 mEq total) by mouth daily as needed. Take w/furosemide  30 tablet  1  . SPIRIVA HANDIHALER 18 MCG inhalation capsule       . SYMBICORT 160-4.5 MCG/ACT inhaler       . DISCONTD: metoprolol tartrate (LOPRESSOR) 25 MG tablet Take 25 mg by mouth daily.      Marland Kitchen DISCONTD: PARoxetine (PAXIL) 20 MG tablet Take 1 tablet (20 mg total) by mouth every morning.  30 tablet  4  . doxycycline (VIBRA-TABS) 100 MG tablet Take 1 tablet (100 mg total) by mouth 2 (two) times daily.  20 tablet  0  . predniSONE (STERAPRED UNI-PAK) 10 MG tablet Take 60mg  day 1 then taper by 10mg  daily  21 tablet  0    Review of Systems  Constitutional: Positive for chills. Negative for fever and unexpected weight change.  HENT: Negative for hearing loss, ear pain, nosebleeds, congestion, sore throat, facial swelling, rhinorrhea, sneezing, mouth sores, trouble swallowing, neck pain, neck stiffness, voice change, postnasal drip, sinus pressure, tinnitus and ear discharge.   Eyes: Negative for pain, discharge, redness and visual disturbance.  Respiratory: Positive for cough and  shortness of breath. Negative for chest tightness, wheezing and stridor.   Cardiovascular: Positive for leg swelling. Negative for chest pain and palpitations.  Musculoskeletal: Negative for myalgias and arthralgias.  Skin: Negative for color change and rash.  Neurological: Negative for dizziness, weakness, light-headedness and headaches.  Hematological: Negative for adenopathy.   BP 148/68  Pulse 78  Temp(Src) 97.6 F (36.4 C) (Oral)  Wt 112 lb (50.803 kg)  SpO2 92%     Objective:   Physical Exam  Constitutional: She is oriented to person, place, and time. She appears well-developed and well-nourished. No distress.  HENT:  Head: Normocephalic and atraumatic.  Right Ear: External ear normal.  Left Ear:  External ear normal.  Nose: Nose normal.  Mouth/Throat: Oropharynx is clear and moist. No oropharyngeal exudate.  Eyes: Conjunctivae are normal. Pupils are equal, round, and reactive to light. Right eye exhibits no discharge. Left eye exhibits no discharge. No scleral icterus.  Neck: Normal range of motion. Neck supple. No tracheal deviation present. No thyromegaly present.  Cardiovascular: Normal rate, regular rhythm, normal heart sounds and intact distal pulses.  Exam reveals no gallop and no friction rub.   No murmur heard. Pulmonary/Chest: Effort normal. No accessory muscle usage. Not tachypneic. No respiratory distress. She has decreased breath sounds. She has no wheezes. She has rhonchi in the right middle field, the right lower field and the left middle field. She has no rales. She exhibits no tenderness.  Musculoskeletal: Normal range of motion. She exhibits no edema and no tenderness.  Lymphadenopathy:    She has no cervical adenopathy.  Neurological: She is alert and oriented to person, place, and time. No cranial nerve deficit. She exhibits normal muscle tone. Coordination normal.  Skin: Skin is warm and dry. No rash noted. She is not diaphoretic. No erythema. No pallor.  Psychiatric: She has a normal mood and affect. Her behavior is normal. Judgment and thought content normal.          Assessment & Plan:

## 2011-07-28 NOTE — Assessment & Plan Note (Signed)
Symptoms and exam are consistent with COPD exacerbation/bronchitis. Will treat with doxycycline and prednisone. Patient will continue her inhalers including inhaled steroids and bronchodilators. She will call if symptoms are not improving over the next 48 hours.

## 2011-07-28 NOTE — Assessment & Plan Note (Signed)
Will request records on recent ultrasound of the lower extremities from vascular surgery.

## 2011-07-29 ENCOUNTER — Telehealth: Payer: Self-pay | Admitting: Internal Medicine

## 2011-07-29 NOTE — Telephone Encounter (Signed)
I received report on [place] from vascular surgeon.  Arterial US was normal.

## 2011-07-29 NOTE — Telephone Encounter (Signed)
Patient informed. 

## 2011-07-31 ENCOUNTER — Encounter: Payer: Self-pay | Admitting: Internal Medicine

## 2011-08-11 ENCOUNTER — Telehealth: Payer: Self-pay | Admitting: Internal Medicine

## 2011-08-11 NOTE — Telephone Encounter (Signed)
[facility] Triage Call Report Triage Record Num: [id number] Operator: Albertine Grates Patient Name: Michele Hayes Call Date & Time: 08/10/2011 4:02:06PM Patient Phone: [phone] PCP: Ronna Polio Patient Gender: Female PCP Fax : [phone] Patient DOB: 10-Dec-1925 Practice Name: [facility] Reason for Call: Caller: Lennox Laity If Racheal Patches [phone]/Other; PCP: Ronna Polio; CB#: [phone]; Call regarding Fluid coming from her legs; Has fluid oozing from edema in legs since 3-31. Legs have been swollen "for a while". Denies pain. Fluid is clear. Advised call office 4-1 for appointment per Leg Non Injury protocol due to swelling that does not decrease with elevation. Protocol(s) Used: Leg Non-Injury Recommended Outcome per Protocol: See Provider within 24 hours Reason for Outcome: New swelling of legs that does NOT resolve with rest and elevation of legs Care Advice: LEG CARE: - Avoid prolonged sitting or standing; take a break to move around every hour or so. - Keep legs raised when sitting, resting or sleeping; when possible raise legs above level of the heart for 20 -30 minutes. - Do not cross your legs. - Wear loose, non-restrictive clothing, especially around waist, groin area and legs. - Consider using support hose if recommended by your provider. ~ 08/10/2011 4:12:02PM Page 1 of 1 [email or web address]

## 2011-08-12 NOTE — Telephone Encounter (Signed)
Left Vm for dtr to call office and schedule apt if swelling has not improved or become worse.

## 2011-08-15 ENCOUNTER — Telehealth: Payer: Self-pay | Admitting: *Deleted

## 2011-08-15 NOTE — Telephone Encounter (Signed)
RN informed.

## 2011-08-15 NOTE — Telephone Encounter (Signed)
Home health RN left VM, pt has lower ext swelling and she recommends UNA boot. OK?

## 2011-08-15 NOTE — Telephone Encounter (Signed)
They can put an UNNA boot on ONE leg, but I wouldn't put them on both legs, because she has CHF and this could result in pulmonary edema.  My preference would be to place Tubigrips, which provide looser compression.

## 2011-08-28 ENCOUNTER — Ambulatory Visit (INDEPENDENT_AMBULATORY_CARE_PROVIDER_SITE_OTHER): Payer: Medicare Other | Admitting: Internal Medicine

## 2011-08-28 ENCOUNTER — Encounter: Payer: Self-pay | Admitting: Internal Medicine

## 2011-08-28 VITALS — BP 122/82 | HR 68 | Temp 97.4°F | Wt 110.5 lb

## 2011-08-28 DIAGNOSIS — M810 Age-related osteoporosis without current pathological fracture: Secondary | ICD-10-CM

## 2011-08-28 DIAGNOSIS — L97909 Non-pressure chronic ulcer of unspecified part of unspecified lower leg with unspecified severity: Secondary | ICD-10-CM

## 2011-08-28 DIAGNOSIS — I87332 Chronic venous hypertension (idiopathic) with ulcer and inflammation of left lower extremity: Secondary | ICD-10-CM

## 2011-08-28 DIAGNOSIS — L97929 Non-pressure chronic ulcer of unspecified part of left lower leg with unspecified severity: Secondary | ICD-10-CM | POA: Insufficient documentation

## 2011-08-28 DIAGNOSIS — I10 Essential (primary) hypertension: Secondary | ICD-10-CM

## 2011-08-28 DIAGNOSIS — R609 Edema, unspecified: Secondary | ICD-10-CM

## 2011-08-28 DIAGNOSIS — I509 Heart failure, unspecified: Secondary | ICD-10-CM

## 2011-08-28 MED ORDER — POTASSIUM CHLORIDE ER 10 MEQ PO TBCR: 10.0000 meq | EXTENDED_RELEASE_TABLET | Freq: Every day | ORAL | Status: AC | PRN

## 2011-08-28 MED ORDER — METOPROLOL TARTRATE 25 MG PO TABS: 25.0000 mg | ORAL_TABLET | Freq: Every day | ORAL | Status: AC

## 2011-08-28 MED ORDER — FUROSEMIDE 20 MG PO TABS: 20.0000 mg | ORAL_TABLET | Freq: Every day | ORAL | Status: AC | PRN

## 2011-08-28 MED ORDER — VITAMIN D 1000 UNITS PO TABS: 1000.0000 [IU] | ORAL_TABLET | Freq: Every day | ORAL | Status: AC

## 2011-08-28 NOTE — Assessment & Plan Note (Signed)
Symptoms and exam are most consistent with acute exacerbation of congestive heart failure. Will have patient increase her Lasix to twice daily for the next 3 days. She will monitor her weight daily and call if greater than 5 pound change in weight. She will return to clinic for recheck next week or sooner if symptoms are not improving.

## 2011-08-28 NOTE — Assessment & Plan Note (Signed)
Patient noted to have bilateral lower extremity edema secondary to both chronic venous hypertension and congestive heart failure. Will increase Lasix to twice daily. Patient will use loose compression socks. Recommended referral to the wound healing Center to help address wound on her left lower leg, but patient would prefer to try managing this at home. She has home health nurse who will examine her on a regular basis. Tegaderm was applied over the wound today. She will followup next week.

## 2011-08-28 NOTE — Progress Notes (Signed)
Subjective:    Patient ID: Michele Hayes, female    DOB: 03-30-1926, 78 y.o.   MRN: [id number]  HPI 78 year old female with history of congestive heart failure, COPD, hypertension, hypothyroidism presents for followup. Her primary concern today is recent worsening of her lower extremity edema. She notes that her legs have been markedly swollen on the last 1-2 weeks. She has developed a wound on her left lower extremity but is unsure how long this has been present. Her daughter notes copious oozing of clear fluid from both of her legs. She continues to take Lasix on a daily basis. She notes significant increase in urination with Lasix but lower extremity edema has not improved. She reports good urinary output. She reports mild worsening in her shortness of breath and cough productive of frothy sputum. She denies any fever or chills. She occasionally has chest tightness with shortness of breath. She denies any change in her diet.  Outpatient Encounter Prescriptions as of 08/28/2011  Medication Sig Dispense Refill  . albuterol (PROVENTIL) (2.5 MG/3ML) 0.083% nebulizer solution Take 3 mLs (2.5 mg total) by nebulization every 6 (six) hours as needed for wheezing.  150 mL  1  . aspirin 81 MG tablet Take 81 mg by mouth daily.        . cholecalciferol (VITAMIN D) 1000 UNITS tablet Take 1 tablet (1,000 Units total) by mouth daily.  30 tablet  3  . diltiazem (DILACOR XR) 180 MG 24 hr capsule Take 1 capsule (180 mg total) by mouth daily.  90 capsule  1  . ferrous sulfate (FERROUSUL) 325 (65 FE) MG tablet Take 1 tablet (325 mg total) by mouth daily with breakfast.  90 tablet  1  . furosemide (LASIX) 20 MG tablet Take 1 tablet (20 mg total) by mouth daily as needed (Swelling).  30 tablet  6  . ipratropium (ATROVENT) 0.02 % nebulizer solution Take 500 mcg by nebulization every 6 (six) hours as needed.      Marland Kitchen levothyroxine (SYNTHROID, LEVOTHROID) 50 MCG tablet Take 1 tablet (50 mcg total) by mouth daily.  30 tablet  6    . lisinopril (PRINIVIL,ZESTRIL) 5 MG tablet Take 5 mg by mouth daily.        . metoprolol tartrate (LOPRESSOR) 25 MG tablet Take 1 tablet (25 mg total) by mouth daily.  30 tablet  6  . PARoxetine (PAXIL) 20 MG tablet Take 1 tablet (20 mg total) by mouth every morning.  30 tablet  4  . potassium chloride (K-DUR) 10 MEQ tablet Take 1 tablet (10 mEq total) by mouth daily as needed. Take w/furosemide  30 tablet  6  . SPIRIVA HANDIHALER 18 MCG inhalation capsule       . SYMBICORT 160-4.5 MCG/ACT inhaler         Review of Systems  Constitutional: Negative for fever, chills, appetite change, fatigue and unexpected weight change.  HENT: Negative for ear pain, congestion, sore throat, trouble swallowing, neck pain, voice change and sinus pressure.   Eyes: Negative for visual disturbance.  Respiratory: Positive for shortness of breath and wheezing. Negative for cough and stridor.   Cardiovascular: Positive for leg swelling. Negative for chest pain and palpitations.  Gastrointestinal: Negative for nausea, vomiting, abdominal pain, diarrhea, constipation, blood in stool, abdominal distention and anal bleeding.  Genitourinary: Negative for dysuria and flank pain.  Musculoskeletal: Negative for myalgias, arthralgias and gait problem.  Skin: Positive for color change and wound. Negative for rash.  Neurological: Negative for dizziness and headaches.  Hematological: Negative for adenopathy. Does not bruise/bleed easily.  Psychiatric/Behavioral: Negative for suicidal ideas, sleep disturbance and dysphoric mood. The patient is not nervous/anxious.    BP 122/82  Pulse 68  Temp(Src) 97.4 F (36.3 C) (Oral)  Wt 110 lb 8 oz (50.122 kg)  SpO2 91%     Objective:   Physical Exam  Constitutional: She is oriented to person, place, and time. She appears well-developed and well-nourished. No distress.  HENT:  Head: Normocephalic and atraumatic.  Right Ear: External ear normal.  Left Ear: External ear normal.   Nose: Nose normal.  Mouth/Throat: Oropharynx is clear and moist. No oropharyngeal exudate.  Eyes: Conjunctivae are normal. Pupils are equal, round, and reactive to light. Right eye exhibits no discharge. Left eye exhibits no discharge. No scleral icterus.  Neck: Normal range of motion. Neck supple. No tracheal deviation present. No thyromegaly present.  Cardiovascular: Normal rate, regular rhythm, normal heart sounds and intact distal pulses.  Exam reveals no gallop and no friction rub.   No murmur heard. Pulmonary/Chest: Effort normal. No accessory muscle usage. Not tachypneic. No respiratory distress. She has decreased breath sounds. She has no wheezes. She has rhonchi (diffuse). She has rales in the right lower field and the left lower field. She exhibits no tenderness.  Musculoskeletal: Normal range of motion. She exhibits no edema and no tenderness.  Lymphadenopathy:    She has no cervical adenopathy.  Neurological: She is alert and oriented to person, place, and time. No cranial nerve deficit. She exhibits normal muscle tone. Coordination normal.  Skin: Skin is warm and dry. Lesion noted. No rash noted. She is not diaphoretic. There is erythema. No pallor.     Psychiatric: She has a normal mood and affect. Her behavior is normal. Judgment and thought content normal.          Assessment & Plan:

## 2011-08-29 ENCOUNTER — Other Ambulatory Visit: Payer: Self-pay | Admitting: *Deleted

## 2011-08-29 MED ORDER — TIOTROPIUM BROMIDE MONOHYDRATE 18 MCG IN CAPS: 18.0000 ug | ORAL_CAPSULE | Freq: Every day | RESPIRATORY_TRACT | Status: AC

## 2011-09-03 ENCOUNTER — Other Ambulatory Visit: Payer: Self-pay | Admitting: Internal Medicine

## 2011-09-03 MED ORDER — BUDESONIDE-FORMOTEROL FUMARATE 160-4.5 MCG/ACT IN AERO: 2.0000 | INHALATION_SPRAY | Freq: Two times a day (BID) | RESPIRATORY_TRACT | Status: AC

## 2011-09-04 ENCOUNTER — Encounter: Payer: Self-pay | Admitting: Internal Medicine

## 2011-09-04 ENCOUNTER — Ambulatory Visit (INDEPENDENT_AMBULATORY_CARE_PROVIDER_SITE_OTHER): Payer: Medicare Other | Admitting: Internal Medicine

## 2011-09-04 VITALS — BP 115/69 | HR 64 | Ht 62.0 in | Wt 107.0 lb

## 2011-09-04 DIAGNOSIS — I87339 Chronic venous hypertension (idiopathic) with ulcer and inflammation of unspecified lower extremity: Secondary | ICD-10-CM

## 2011-09-04 DIAGNOSIS — R0600 Dyspnea, unspecified: Secondary | ICD-10-CM

## 2011-09-04 DIAGNOSIS — L97929 Non-pressure chronic ulcer of unspecified part of left lower leg with unspecified severity: Secondary | ICD-10-CM

## 2011-09-04 DIAGNOSIS — R0609 Other forms of dyspnea: Secondary | ICD-10-CM

## 2011-09-04 DIAGNOSIS — I509 Heart failure, unspecified: Secondary | ICD-10-CM

## 2011-09-04 DIAGNOSIS — E039 Hypothyroidism, unspecified: Secondary | ICD-10-CM

## 2011-09-04 DIAGNOSIS — J441 Chronic obstructive pulmonary disease with (acute) exacerbation: Secondary | ICD-10-CM

## 2011-09-04 DIAGNOSIS — R0989 Other specified symptoms and signs involving the circulatory and respiratory systems: Secondary | ICD-10-CM

## 2011-09-04 MED ORDER — LEVOFLOXACIN 500 MG PO TABS: 500.0000 mg | ORAL_TABLET | Freq: Every day | ORAL | Status: DC

## 2011-09-04 MED ORDER — LEVOTHYROXINE SODIUM 50 MCG PO TABS: 50.0000 ug | ORAL_TABLET | Freq: Every day | ORAL | Status: AC

## 2011-09-04 MED ORDER — PREDNISONE (PAK) 10 MG PO TABS: ORAL_TABLET | ORAL | Status: DC

## 2011-09-04 NOTE — Assessment & Plan Note (Signed)
Symptoms improved, however she is still volume overloaded. Will continue Lasix every morning. Patient has home health in place and they can set up daily weights with information sent to our office so we can adjust Lasix dose as needed. Patient will followup in 2 weeks.

## 2011-09-04 NOTE — Progress Notes (Signed)
Subjective:    Patient ID: Michele Hayes, female    DOB: 03-Sep-1925, 78 y.o.   MRN: [id number]  HPI 78 year old female with congestive heart failure, COPD, and recent left lower extremity wound presents for followup. She did not take Lasix twice daily as instructed. She reports that when she takes Lasix twice daily she is up all night urinating. She has been taking Lasix once daily and weight is down 4 pounds today. She notes no improvement in her lower extremity edema. There is minimal improvement in her shortness of breath. She continues to have chronic cough which is now productive of purulent sputum. She denies any fever or chills. The wound on her lower extremity has healed.  Outpatient Encounter Prescriptions as of 09/04/2011  Medication Sig Dispense Refill  . albuterol (PROVENTIL) (2.5 MG/3ML) 0.083% nebulizer solution Take 3 mLs (2.5 mg total) by nebulization every 6 (six) hours as needed for wheezing.  150 mL  1  . aspirin 81 MG tablet Take 81 mg by mouth daily.        . budesonide-formoterol (SYMBICORT) 160-4.5 MCG/ACT inhaler Inhale 2 puffs into the lungs 2 (two) times daily.  10.2 g  3  . cholecalciferol (VITAMIN D) 1000 UNITS tablet Take 1 tablet (1,000 Units total) by mouth daily.  30 tablet  3  . diltiazem (DILACOR XR) 180 MG 24 hr capsule Take 1 capsule (180 mg total) by mouth daily.  90 capsule  1  . ferrous sulfate (FERROUSUL) 325 (65 FE) MG tablet Take 1 tablet (325 mg total) by mouth daily with breakfast.  90 tablet  1  . furosemide (LASIX) 20 MG tablet Take 1 tablet (20 mg total) by mouth daily as needed (Swelling).  30 tablet  6  . ipratropium (ATROVENT) 0.02 % nebulizer solution Take 500 mcg by nebulization every 6 (six) hours as needed.      Marland Kitchen levothyroxine (SYNTHROID, LEVOTHROID) 50 MCG tablet Take 1 tablet (50 mcg total) by mouth daily.  30 tablet  6  . lisinopril (PRINIVIL,ZESTRIL) 5 MG tablet Take 5 mg by mouth daily.        . metoprolol tartrate (LOPRESSOR) 25 MG tablet  Take 1 tablet (25 mg total) by mouth daily.  30 tablet  6  . PARoxetine (PAXIL) 20 MG tablet Take 1 tablet (20 mg total) by mouth every morning.  30 tablet  4  . potassium chloride (K-DUR) 10 MEQ tablet Take 1 tablet (10 mEq total) by mouth daily as needed. Take w/furosemide  30 tablet  6  . tiotropium (SPIRIVA HANDIHALER) 18 MCG inhalation capsule Place 1 capsule (18 mcg total) into inhaler and inhale daily.  30 capsule  6  . DISCONTD: levothyroxine (SYNTHROID, LEVOTHROID) 50 MCG tablet Take 1 tablet (50 mcg total) by mouth daily.  30 tablet  6  . levofloxacin (LEVAQUIN) 500 MG tablet Take 1 tablet (500 mg total) by mouth daily.  7 tablet  0    Review of Systems  Constitutional: Positive for fatigue. Negative for fever, chills, appetite change and unexpected weight change.  HENT: Negative for ear pain, congestion, sore throat, trouble swallowing, neck pain, voice change and sinus pressure.   Eyes: Negative for visual disturbance.  Respiratory: Positive for cough, shortness of breath and wheezing. Negative for stridor.   Cardiovascular: Positive for leg swelling. Negative for chest pain and palpitations.  Gastrointestinal: Negative for nausea, vomiting, abdominal pain, diarrhea, constipation, blood in stool, abdominal distention and anal bleeding.  Genitourinary: Negative for dysuria and flank  pain.  Musculoskeletal: Negative for myalgias, arthralgias and gait problem.  Skin: Negative for color change and rash.  Neurological: Negative for dizziness and headaches.  Hematological: Negative for adenopathy. Does not bruise/bleed easily.  Psychiatric/Behavioral: Negative for suicidal ideas, sleep disturbance and dysphoric mood. The patient is not nervous/anxious.    BP 115/69  Pulse 64  Ht 5\' 2"  (1.575 m)  Wt 107 lb (48.535 kg)  BMI 19.57 kg/m2  SpO2 87%     Objective:   Physical Exam  Constitutional: She is oriented to person, place, and time. She appears well-developed and  well-nourished. No distress.  HENT:  Head: Normocephalic and atraumatic.  Right Ear: External ear normal.  Left Ear: External ear normal.  Nose: Nose normal.  Mouth/Throat: Oropharynx is clear and moist. No oropharyngeal exudate.  Eyes: Conjunctivae are normal. Pupils are equal, round, and reactive to light. Right eye exhibits no discharge. Left eye exhibits no discharge. No scleral icterus.  Neck: Normal range of motion. Neck supple. No tracheal deviation present. No thyromegaly present.  Cardiovascular: Normal rate, regular rhythm, normal heart sounds and intact distal pulses.  Exam reveals no gallop and no friction rub.   No murmur heard. Pulmonary/Chest: Accessory muscle usage present. Tachypnea noted. No respiratory distress. She has decreased breath sounds (prolonged expiration). She has wheezes. She has rhonchi in the right lower field. She has rales (diffuse). She exhibits no tenderness.  Musculoskeletal: Normal range of motion. She exhibits edema (pitting to knees). She exhibits no tenderness.  Lymphadenopathy:    She has no cervical adenopathy.  Neurological: She is alert and oriented to person, place, and time. No cranial nerve deficit. She exhibits normal muscle tone. Coordination normal.  Skin: Skin is warm and dry. No rash noted. She is not diaphoretic. No erythema. No pallor.     Psychiatric: She has a normal mood and affect. Her behavior is normal. Judgment and thought content normal.          Assessment & Plan:

## 2011-09-04 NOTE — Assessment & Plan Note (Signed)
Patient having some mucopurulent sputum. Will add Levaquin for empiric coverage. Will continue inhaled bronchodilators. Will add prednisone taper.

## 2011-09-04 NOTE — Assessment & Plan Note (Signed)
Wound has epithelialized. Will continue to monitor. Patient will continue to protect with Tegaderm.

## 2011-09-05 LAB — COMPREHENSIVE METABOLIC PANEL
CO2: 37 mEq/L — ABNORMAL HIGH (ref 19–32)
Calcium: 9.7 mg/dL (ref 8.4–10.5)
Creatinine, Ser: 0.5 mg/dL (ref 0.4–1.2)
GFR: 124.93 mL/min (ref >60.00)
Glucose, Bld: 85 mg/dL (ref 70–99)
Total Bilirubin: 0.3 mg/dL (ref 0.3–1.2)
Total Protein: 7.2 g/dL (ref 6.0–8.3)

## 2011-09-05 LAB — BRAIN NATRIURETIC PEPTIDE: Pro B Natriuretic peptide (BNP): 271 pg/mL — ABNORMAL HIGH (ref 0.0–100.0)

## 2011-09-18 ENCOUNTER — Ambulatory Visit (INDEPENDENT_AMBULATORY_CARE_PROVIDER_SITE_OTHER): Payer: Medicare Other | Admitting: Internal Medicine

## 2011-09-18 ENCOUNTER — Encounter: Payer: Self-pay | Admitting: Internal Medicine

## 2011-09-18 VITALS — BP 120/60 | HR 64 | Temp 97.9°F | Ht 62.0 in | Wt 107.0 lb

## 2011-09-18 DIAGNOSIS — E611 Iron deficiency: Secondary | ICD-10-CM

## 2011-09-18 DIAGNOSIS — E039 Hypothyroidism, unspecified: Secondary | ICD-10-CM

## 2011-09-18 DIAGNOSIS — D510 Vitamin B12 deficiency anemia due to intrinsic factor deficiency: Secondary | ICD-10-CM

## 2011-09-18 DIAGNOSIS — J449 Chronic obstructive pulmonary disease, unspecified: Secondary | ICD-10-CM

## 2011-09-18 DIAGNOSIS — I509 Heart failure, unspecified: Secondary | ICD-10-CM

## 2011-09-18 DIAGNOSIS — L659 Nonscarring hair loss, unspecified: Secondary | ICD-10-CM | POA: Insufficient documentation

## 2011-09-18 DIAGNOSIS — I10 Essential (primary) hypertension: Secondary | ICD-10-CM

## 2011-09-18 MED ORDER — DILTIAZEM HCL ER 180 MG PO CP24: 180.0000 mg | ORAL_CAPSULE | Freq: Every day | ORAL | Status: DC

## 2011-09-18 MED ORDER — FERROUS SULFATE 325 (65 FE) MG PO TABS: 325.0000 mg | ORAL_TABLET | Freq: Every day | ORAL | Status: DC

## 2011-09-18 NOTE — Assessment & Plan Note (Signed)
Likely related to multiple chronic illnesses and repeated use of prednisone. However will check thyroid function and blood counts with labs today.

## 2011-09-18 NOTE — Assessment & Plan Note (Addendum)
Symptoms and exam are improved after recent treatment with prednisone and Levaquin. Will continue to monitor. Continue inhaled bronchodilators. Followup one month or as needed.

## 2011-09-18 NOTE — Assessment & Plan Note (Signed)
Appears euvolemic on exam today. Lower extremity edema is chronic and likely secondary to chronic venous insufficiency. Will check electrolytes and will continue daily Lasix. Followup in one month.renal function with labs today.

## 2011-09-18 NOTE — Progress Notes (Signed)
Subjective:    Patient ID: Michele Hayes, female    DOB: [birth year], 78 y.o.   MRN: [id number]  HPI 78 year old female with history of COPD, congestive heart failure presents for followup. She recently had an exacerbation of her COPD was treated with prednisone and Levaquin. She has also had some recent difficulty with fluid balance and episodes of dyspnea related to exacerbation of congestive heart failure. She reports that her symptoms are improved. She continues to have some shortness of breath and cough productive of white-colored sputum in the mornings. However this is much reduced in quantity versus last visit. She has not had fever or chills. She has not had chest pain. She continues to have some mild swelling in her lower extremities.  Her primary concern today is hair loss. She has noted thinning of her hair over the top of her scalp. She questions whether this may be related to vitamin deficiency or medications.  Outpatient Encounter Prescriptions as of 09/18/2011  Medication Sig Dispense Refill  . albuterol (PROVENTIL) (2.5 MG/3ML) 0.083% nebulizer solution Take 3 mLs (2.5 mg total) by nebulization every 6 (six) hours as needed for wheezing.  150 mL  1  . aspirin 81 MG tablet Take 81 mg by mouth daily.        . budesonide-formoterol (SYMBICORT) 160-4.5 MCG/ACT inhaler Inhale 2 puffs into the lungs 2 (two) times daily.  10.2 g  3  . cholecalciferol (VITAMIN D) 1000 UNITS tablet Take 1 tablet (1,000 Units total) by mouth daily.  30 tablet  3  . diltiazem (DILACOR XR) 180 MG 24 hr capsule Take 1 capsule (180 mg total) by mouth daily.  90 capsule  1  . ferrous sulfate (FERROUSUL) 325 (65 FE) MG tablet Take 1 tablet (325 mg total) by mouth daily with breakfast.  90 tablet  1  . furosemide (LASIX) 20 MG tablet Take 1 tablet (20 mg total) by mouth daily as needed (Swelling).  30 tablet  6  . ipratropium (ATROVENT) 0.02 % nebulizer solution Take 500 mcg by nebulization every 6 (six) hours as needed.       Marland Kitchen levothyroxine (SYNTHROID, LEVOTHROID) 50 MCG tablet Take 1 tablet (50 mcg total) by mouth daily.  30 tablet  6  . lisinopril (PRINIVIL,ZESTRIL) 5 MG tablet Take 5 mg by mouth daily.        . metoprolol tartrate (LOPRESSOR) 25 MG tablet Take 1 tablet (25 mg total) by mouth daily.  30 tablet  6  . PARoxetine (PAXIL) 20 MG tablet Take 1 tablet (20 mg total) by mouth every morning.  30 tablet  4  . potassium chloride (K-DUR) 10 MEQ tablet Take 1 tablet (10 mEq total) by mouth daily as needed. Take w/furosemide  30 tablet  6  . tiotropium (SPIRIVA HANDIHALER) 18 MCG inhalation capsule Place 1 capsule (18 mcg total) into inhaler and inhale daily.  30 capsule  6  . DISCONTD: diltiazem (DILACOR XR) 180 MG 24 hr capsule Take 1 capsule (180 mg total) by mouth daily.  90 capsule  1  . DISCONTD: ferrous sulfate (FERROUSUL) 325 (65 FE) MG tablet Take 1 tablet (325 mg total) by mouth daily with breakfast.  90 tablet  1   BP 120/60  Pulse 64  Temp(Src) 97.9 F (36.6 C) (Oral)  Ht 5\' 2"  (1.575 m)  Wt 107 lb (48.535 kg)  BMI 19.57 kg/m2  Review of Systems  Constitutional: Negative for fever, chills, appetite change, fatigue and unexpected weight change.  HENT: Negative  for ear pain, congestion, sore throat, trouble swallowing, neck pain, voice change and sinus pressure.   Eyes: Negative for visual disturbance.  Respiratory: Positive for cough and shortness of breath. Negative for wheezing and stridor.   Cardiovascular: Positive for leg swelling. Negative for chest pain and palpitations.  Gastrointestinal: Negative for nausea, vomiting, abdominal pain, diarrhea, constipation, blood in stool, abdominal distention and anal bleeding.  Genitourinary: Negative for dysuria and flank pain.  Musculoskeletal: Negative for myalgias, arthralgias and gait problem.  Skin: Negative for color change and rash.  Neurological: Negative for dizziness and headaches.  Hematological: Negative for adenopathy. Does not  bruise/bleed easily.  Psychiatric/Behavioral: Negative for suicidal ideas, sleep disturbance and dysphoric mood. The patient is not nervous/anxious.        Objective:   Physical Exam  Constitutional: She is oriented to person, place, and time. She appears well-developed and well-nourished. No distress.  HENT:  Head: Normocephalic and atraumatic.  Right Ear: External ear normal.  Left Ear: External ear normal.  Nose: Nose normal.  Mouth/Throat: Oropharynx is clear and moist. No oropharyngeal exudate.  Eyes: Conjunctivae are normal. Pupils are equal, round, and reactive to light. Right eye exhibits no discharge. Left eye exhibits no discharge. No scleral icterus.  Neck: Normal range of motion. Neck supple. No tracheal deviation present. No thyromegaly present.  Cardiovascular: Normal rate, regular rhythm, normal heart sounds and intact distal pulses.  Exam reveals no gallop and no friction rub.   No murmur heard. Pulmonary/Chest: Effort normal. No accessory muscle usage. Not tachypneic. No respiratory distress. She has decreased breath sounds (prolonged exp). She has no wheezes. She has no rhonchi. She has no rales. She exhibits no tenderness.  Musculoskeletal: Normal range of motion. She exhibits edema (pitting to knee bilaterally). She exhibits no tenderness.  Lymphadenopathy:    She has no cervical adenopathy.  Neurological: She is alert and oriented to person, place, and time. No cranial nerve deficit. She exhibits normal muscle tone. Coordination normal.  Skin: Skin is warm and dry. No rash noted. She is not diaphoretic. No erythema. No pallor.  Psychiatric: She has a normal mood and affect. Her behavior is normal. Judgment and thought content normal.          Assessment & Plan:

## 2011-09-19 NOTE — Progress Notes (Signed)
Addended by: Jobie Quaker on: 09/19/2011 02:50 PM   Modules accepted: Orders

## 2011-09-20 LAB — COMPREHENSIVE METABOLIC PANEL
ALT: 20 U/L (ref 0–35)
AST: 31 U/L (ref 0–37)
Albumin: 3.5 g/dL (ref 3.5–5.2)
Alkaline Phosphatase: 64 U/L (ref 39–117)
Calcium: 9.1 mg/dL (ref 8.4–10.5)
Chloride: 81 mEq/L — ABNORMAL LOW (ref 96–112)
Creat: 0.59 mg/dL (ref 0.50–1.10)
Potassium: 4.9 mEq/L (ref 3.5–5.3)

## 2011-09-20 LAB — TSH

## 2011-09-20 LAB — CBC WITH DIFFERENTIAL/PLATELET
Basophils Absolute: 0 10*3/uL (ref 0.0–0.1)
Basophils Relative: 0 % (ref 0–1)
Eosinophils Absolute: 0.1 10*3/uL (ref 0.0–0.7)
Eosinophils Relative: 1 % (ref 0–5)
HCT: 43.9 % (ref 36.0–46.0)
Hemoglobin: 13.1 g/dL (ref 12.0–15.0)
MCH: 28.3 pg (ref 26.0–34.0)
MCHC: 29.8 g/dL — ABNORMAL LOW (ref 30.0–36.0)
Monocytes Absolute: 0.7 10*3/uL (ref 0.1–1.0)
Monocytes Relative: 6 % (ref 3–12)
Neutro Abs: 8.9 10*3/uL — ABNORMAL HIGH (ref 1.7–7.7)
RDW: 15.8 % — ABNORMAL HIGH (ref 11.5–15.5)

## 2011-09-30 ENCOUNTER — Telehealth: Payer: Self-pay | Admitting: Internal Medicine

## 2011-09-30 NOTE — Telephone Encounter (Signed)
Caller: Jodi/Other; PCP: Ronna Polio; CB#: [phone]; Call regarding Cough/Congestion; Temp 99.1 by mouth. Onset 5/20 feeling weak and cough onset 5/21.  Yellow sputum reported.   Home care for the interim and parameters for callback per Cough protocol.  No appointments available at office.  Checked with Jasmine December at office.  Instructed to send her to UC for evaluation and treatment.

## 2011-10-01 ENCOUNTER — Inpatient Hospital Stay: Payer: Self-pay | Admitting: Internal Medicine

## 2011-10-01 DIAGNOSIS — R9431 Abnormal electrocardiogram [ECG] [EKG]: Secondary | ICD-10-CM

## 2011-10-01 LAB — COMPREHENSIVE METABOLIC PANEL
Anion Gap: 2 — ABNORMAL LOW (ref 7–16)
BUN: 19 mg/dL — ABNORMAL HIGH (ref 7–18)
Bilirubin,Total: 0.5 mg/dL (ref 0.2–1.0)
Calcium, Total: 8.6 mg/dL (ref 8.5–10.1)
Creatinine: 0.51 mg/dL — ABNORMAL LOW (ref 0.60–1.30)
Potassium: 4 mmol/L (ref 3.5–5.1)
SGOT(AST): 34 U/L (ref 15–37)
SGPT (ALT): 29 U/L
Sodium: 134 mmol/L — ABNORMAL LOW (ref 136–145)

## 2011-10-01 LAB — TROPONIN I: Troponin-I: <0.02 ng/mL

## 2011-10-01 LAB — CBC
MCV: 92 fL (ref 80–100)
RDW: 16.1 % — ABNORMAL HIGH (ref 11.5–14.5)

## 2011-10-02 DIAGNOSIS — R0602 Shortness of breath: Secondary | ICD-10-CM

## 2011-10-02 LAB — BASIC METABOLIC PANEL
Anion Gap: 5 — ABNORMAL LOW (ref 7–16)
Chloride: 91 mmol/L — ABNORMAL LOW (ref 98–107)
Creatinine: 0.53 mg/dL — ABNORMAL LOW (ref 0.60–1.30)
EGFR (African American): >60
Osmolality: 273 (ref 275–301)
Potassium: 4.2 mmol/L (ref 3.5–5.1)
Sodium: 134 mmol/L — ABNORMAL LOW (ref 136–145)

## 2011-10-02 LAB — TROPONIN I: Troponin-I: <0.02 ng/mL

## 2011-10-02 LAB — CBC WITH DIFFERENTIAL/PLATELET
Basophil #: 0 10*3/uL (ref 0.0–0.1)
Basophil %: 0 %
Eosinophil #: 0 10*3/uL (ref 0.0–0.7)
Eosinophil %: 0 %
HCT: 39.6 % (ref 35.0–47.0)
HGB: 12.2 g/dL (ref 12.0–16.0)
Lymphocyte #: 0.6 10*3/uL — ABNORMAL LOW (ref 1.0–3.6)
MCH: 28.4 pg (ref 26.0–34.0)
MCHC: 30.8 g/dL — ABNORMAL LOW (ref 32.0–36.0)
MCV: 92 fL (ref 80–100)
Monocyte #: 0.3 x10 3/mm (ref 0.2–0.9)
Monocyte %: 2.3 %
Neutrophil #: 11.9 10*3/uL — ABNORMAL HIGH (ref 1.4–6.5)
Platelet: 255 10*3/uL (ref 150–440)
RBC: 4.29 10*6/uL (ref 3.80–5.20)
RDW: 16.5 % — ABNORMAL HIGH (ref 11.5–14.5)
WBC: 12.8 10*3/uL — ABNORMAL HIGH (ref 3.6–11.0)

## 2011-10-02 LAB — LIPID PANEL
Ldl Cholesterol, Calc: 80 mg/dL (ref 0–100)
Triglycerides: 70 mg/dL (ref 0–200)
VLDL Cholesterol, Calc: 14 mg/dL (ref 5–40)

## 2011-10-02 LAB — TSH: Thyroid Stimulating Horm: 1.31 u[IU]/mL

## 2011-10-10 ENCOUNTER — Ambulatory Visit: Payer: Medicare Other | Admitting: Internal Medicine

## 2011-10-16 ENCOUNTER — Encounter: Payer: Self-pay | Admitting: Internal Medicine

## 2011-10-17 ENCOUNTER — Ambulatory Visit: Payer: Medicare Other | Admitting: Internal Medicine

## 2011-10-20 ENCOUNTER — Ambulatory Visit: Payer: Medicare Other | Admitting: Internal Medicine

## 2011-10-20 ENCOUNTER — Ambulatory Visit (INDEPENDENT_AMBULATORY_CARE_PROVIDER_SITE_OTHER): Payer: Medicare Other | Admitting: Internal Medicine

## 2011-10-20 ENCOUNTER — Encounter: Payer: Self-pay | Admitting: Internal Medicine

## 2011-10-20 VITALS — BP 120/76 | HR 62 | Temp 98.6°F | Wt 109.2 lb

## 2011-10-20 DIAGNOSIS — I10 Essential (primary) hypertension: Secondary | ICD-10-CM

## 2011-10-20 DIAGNOSIS — E039 Hypothyroidism, unspecified: Secondary | ICD-10-CM

## 2011-10-20 DIAGNOSIS — I509 Heart failure, unspecified: Secondary | ICD-10-CM

## 2011-10-20 DIAGNOSIS — D509 Iron deficiency anemia, unspecified: Secondary | ICD-10-CM

## 2011-10-20 DIAGNOSIS — L659 Nonscarring hair loss, unspecified: Secondary | ICD-10-CM

## 2011-10-20 DIAGNOSIS — J449 Chronic obstructive pulmonary disease, unspecified: Secondary | ICD-10-CM

## 2011-10-20 DIAGNOSIS — E611 Iron deficiency: Secondary | ICD-10-CM

## 2011-10-20 DIAGNOSIS — Z66 Do not resuscitate: Secondary | ICD-10-CM

## 2011-10-20 MED ORDER — DILTIAZEM HCL ER 180 MG PO CP24: 180.0000 mg | ORAL_CAPSULE | Freq: Every day | ORAL | Status: DC

## 2011-10-20 MED ORDER — FERROUS SULFATE 325 (65 FE) MG PO TABS: 325.0000 mg | ORAL_TABLET | Freq: Every day | ORAL | Status: DC

## 2011-10-20 MED ORDER — ROFLUMILAST 500 MCG PO TABS: 500.0000 ug | ORAL_TABLET | Freq: Every day | ORAL | Status: DC

## 2011-10-20 NOTE — Assessment & Plan Note (Signed)
Symptomatically doing well at present. Encouraged her to take lasix daily. Will check electrolytes and renal function with labs today.

## 2011-10-20 NOTE — Assessment & Plan Note (Signed)
Will check TSH with labs today. 

## 2011-10-20 NOTE — Progress Notes (Signed)
Subjective:    Patient ID: Michele Hayes, female    DOB: [birth year], 78 y.o.   MRN: [id number]  HPI 78 year old female with history of COPD, congestive heart failure, hypertension, iron deficiency presents for followup after recent hospitalization. She was admitted with COPD exacerbation. She was treated with intervenous antibiotics and steroids. She reports difficulty tolerating IV steroids because of hallucinations. She reports that, since discharge, her breathing has improved significantly. She continues on her inhalers. She has completed her prednisone taper and antibiotics. She denies any fever, chills. She has some cough productive of whitish sputum. This is her baseline. She has not had any chest pain. Shortness of breath on exertion is also present at her baseline. At home, she is using 2 L of oxygen by nasal cannula. She reports some improvement in symptoms with this. She reports full compliance with her medications.  Outpatient Encounter Prescriptions as of 10/20/2011  Medication Sig Dispense Refill  . albuterol (PROVENTIL) (2.5 MG/3ML) 0.083% nebulizer solution Take 3 mLs (2.5 mg total) by nebulization every 6 (six) hours as needed for wheezing.  150 mL  1  . aspirin 81 MG tablet Take 81 mg by mouth daily.        . budesonide-formoterol (SYMBICORT) 160-4.5 MCG/ACT inhaler Inhale 2 puffs into the lungs 2 (two) times daily.  10.2 g  3  . cholecalciferol (VITAMIN D) 1000 UNITS tablet Take 1 tablet (1,000 Units total) by mouth daily.  30 tablet  3  . diltiazem (DILACOR XR) 180 MG 24 hr capsule Take 1 capsule (180 mg total) by mouth daily.  90 capsule  1  . ferrous sulfate (FERROUSUL) 325 (65 FE) MG tablet Take 1 tablet (325 mg total) by mouth daily with breakfast.  90 tablet  1  . furosemide (LASIX) 20 MG tablet Take 1 tablet (20 mg total) by mouth daily as needed (Swelling).  30 tablet  6  . ipratropium (ATROVENT) 0.02 % nebulizer solution Take 500 mcg by nebulization every 6 (six) hours as  needed.      Marland Kitchen levothyroxine (SYNTHROID, LEVOTHROID) 50 MCG tablet Take 1 tablet (50 mcg total) by mouth daily.  30 tablet  6  . lisinopril (PRINIVIL,ZESTRIL) 5 MG tablet Take 5 mg by mouth daily.        . metoprolol tartrate (LOPRESSOR) 25 MG tablet Take 1 tablet (25 mg total) by mouth daily.  30 tablet  6  . PARoxetine (PAXIL) 20 MG tablet Take 1 tablet (20 mg total) by mouth every morning.  30 tablet  4  . potassium chloride (K-DUR) 10 MEQ tablet Take 1 tablet (10 mEq total) by mouth daily as needed. Take w/furosemide  30 tablet  6  . tiotropium (SPIRIVA HANDIHALER) 18 MCG inhalation capsule Place 1 capsule (18 mcg total) into inhaler and inhale daily.  30 capsule  6  . DISCONTD: diltiazem (DILACOR XR) 180 MG 24 hr capsule Take 1 capsule (180 mg total) by mouth daily.  90 capsule  1  . DISCONTD: ferrous sulfate (FERROUSUL) 325 (65 FE) MG tablet Take 1 tablet (325 mg total) by mouth daily with breakfast.  90 tablet  1  . roflumilast (DALIRESP) 500 MCG TABS tablet Take 1 tablet (500 mcg total) by mouth daily.  30 tablet  3   BP 120/76  Pulse 62  Temp 98.6 F (37 C)  Wt 109 lb 4 oz (49.555 kg)  SpO2 86%  Review of Systems  Constitutional: Positive for fatigue. Negative for fever, chills, appetite change and  unexpected weight change.  HENT: Negative for ear pain, congestion, sore throat, trouble swallowing, neck pain, voice change and sinus pressure.   Eyes: Negative for visual disturbance.  Respiratory: Positive for cough and shortness of breath. Negative for wheezing and stridor.   Cardiovascular: Positive for leg swelling. Negative for chest pain and palpitations.  Gastrointestinal: Negative for nausea, vomiting, abdominal pain, diarrhea, constipation, blood in stool, abdominal distention and anal bleeding.  Genitourinary: Negative for dysuria and flank pain.  Musculoskeletal: Negative for myalgias, arthralgias and gait problem.  Skin: Negative for color change and rash.  Neurological:  Negative for dizziness and headaches.  Hematological: Negative for adenopathy. Does not bruise/bleed easily.  Psychiatric/Behavioral: Negative for suicidal ideas, sleep disturbance and dysphoric mood. The patient is not nervous/anxious.        Objective:   Physical Exam  Constitutional: She is oriented to person, place, and time. She appears well-developed and well-nourished. She appears cachectic. She appears ill (chronic). No distress.  HENT:  Head: Normocephalic and atraumatic.  Right Ear: External ear normal.  Left Ear: External ear normal.  Nose: Nose normal.  Mouth/Throat: Oropharynx is clear and moist. No oropharyngeal exudate.  Eyes: Conjunctivae are normal. Pupils are equal, round, and reactive to light. Right eye exhibits no discharge. Left eye exhibits no discharge. No scleral icterus.  Neck: Normal range of motion. Neck supple. No tracheal deviation present. No thyromegaly present.  Cardiovascular: Normal rate, regular rhythm, normal heart sounds and intact distal pulses.  Exam reveals no gallop and no friction rub.   No murmur heard. Pulmonary/Chest: Effort normal. No accessory muscle usage. Not tachypneic. No respiratory distress. She has decreased breath sounds. She has no wheezes. She has rhonchi (scattered at bases bilaterally). She has no rales. She exhibits no tenderness.  Musculoskeletal: Normal range of motion. She exhibits no edema and no tenderness.  Lymphadenopathy:    She has no cervical adenopathy.  Neurological: She is alert and oriented to person, place, and time. No cranial nerve deficit. She exhibits normal muscle tone. Coordination normal.  Skin: Skin is warm and dry. No rash noted. She is not diaphoretic. No erythema. No pallor.  Psychiatric: She has a normal mood and affect. Her behavior is normal. Judgment and thought content normal.          Assessment & Plan:

## 2011-10-20 NOTE — Assessment & Plan Note (Signed)
Several recent exacerbations, most recently resulting in hospitalization. Will try adding Daliresp to see if any improvement in frequency of exacerbations. Will continue inhaled bronchodilators and steroids. Pt will call if symptoms worsening. Follow up 3 weeks or sooner if needed.

## 2011-10-21 LAB — COMPREHENSIVE METABOLIC PANEL
Alkaline Phosphatase: 74 U/L (ref 39–117)
BUN: 16 mg/dL (ref 6–23)
CO2: 37 mEq/L — ABNORMAL HIGH (ref 19–32)
Creatinine, Ser: 0.5 mg/dL (ref 0.4–1.2)
GFR: 124.89 mL/min (ref >60.00)
Glucose, Bld: 63 mg/dL — ABNORMAL LOW (ref 70–99)
Sodium: 141 mEq/L (ref 135–145)
Total Bilirubin: 0.4 mg/dL (ref 0.3–1.2)

## 2011-10-21 LAB — TSH: TSH: 3.24 u[IU]/mL (ref 0.35–5.50)

## 2011-11-10 ENCOUNTER — Ambulatory Visit (INDEPENDENT_AMBULATORY_CARE_PROVIDER_SITE_OTHER): Payer: Medicare Other | Admitting: Internal Medicine

## 2011-11-10 ENCOUNTER — Encounter: Payer: Self-pay | Admitting: Internal Medicine

## 2011-11-10 VITALS — BP 130/62 | HR 60 | Temp 98.7°F | Ht 62.0 in | Wt 100.5 lb

## 2011-11-10 DIAGNOSIS — R634 Abnormal weight loss: Secondary | ICD-10-CM

## 2011-11-10 DIAGNOSIS — J441 Chronic obstructive pulmonary disease with (acute) exacerbation: Secondary | ICD-10-CM | POA: Insufficient documentation

## 2011-11-10 MED ORDER — LEVOFLOXACIN 500 MG PO TABS: 500.0000 mg | ORAL_TABLET | Freq: Every day | ORAL | Status: AC

## 2011-11-10 MED ORDER — PREDNISONE (PAK) 10 MG PO TABS: ORAL_TABLET | ORAL | Status: AC

## 2011-11-10 NOTE — Assessment & Plan Note (Signed)
Symptoms near baseline, however the last couple of days having some increased production of sputum. Question if this may be early COPD exacerbation. Prescription for prednisone taper and Levaquin given today. She will continue to monitor symptoms for the next 24 hours. If no improvement, she will start medications. She will call if she starts prednisone and Levaquin. Followup in one month.

## 2011-11-10 NOTE — Progress Notes (Signed)
Subjective:    Patient ID: Michele Hayes, female    DOB: 05-08-1926, 78 y.o.   MRN: [id number]  HPI 78 year old female with history of congestive heart failure and COPD presents for followup. At her last visit, we started Daliresp in effort to improve the frequency of COPD exacerbations. However, she has noted significant nausea and decrease in appetite with this medication. She has lost nearly 10 pounds since her last visit. She tried a day off the medication symptoms improved markedly. She reports that her breathing is at baseline. She is occasionally having some cough productive of white sputum. She denies any fever, chills, chest pain. She reports full compliance with her medicines.  Outpatient Encounter Prescriptions as of 11/10/2011  Medication Sig Dispense Refill  . albuterol (PROVENTIL) (2.5 MG/3ML) 0.083% nebulizer solution Take 3 mLs (2.5 mg total) by nebulization every 6 (six) hours as needed for wheezing.  150 mL  1  . aspirin 81 MG tablet Take 81 mg by mouth daily.        . budesonide-formoterol (SYMBICORT) 160-4.5 MCG/ACT inhaler Inhale 2 puffs into the lungs 2 (two) times daily.  10.2 g  3  . cholecalciferol (VITAMIN D) 1000 UNITS tablet Take 1 tablet (1,000 Units total) by mouth daily.  30 tablet  3  . diltiazem (DILACOR XR) 180 MG 24 hr capsule Take 1 capsule (180 mg total) by mouth daily.  90 capsule  1  . ferrous sulfate (FERROUSUL) 325 (65 FE) MG tablet Take 1 tablet (325 mg total) by mouth daily with breakfast.  90 tablet  1  . furosemide (LASIX) 20 MG tablet Take 1 tablet (20 mg total) by mouth daily as needed (Swelling).  30 tablet  6  . ipratropium (ATROVENT) 0.02 % nebulizer solution Take 500 mcg by nebulization every 6 (six) hours as needed.      Marland Kitchen levothyroxine (SYNTHROID, LEVOTHROID) 50 MCG tablet Take 1 tablet (50 mcg total) by mouth daily.  30 tablet  6  . lisinopril (PRINIVIL,ZESTRIL) 5 MG tablet Take 5 mg by mouth daily.        . metoprolol tartrate (LOPRESSOR) 25 MG  tablet Take 1 tablet (25 mg total) by mouth daily.  30 tablet  6  . PARoxetine (PAXIL) 20 MG tablet Take 1 tablet (20 mg total) by mouth every morning.  30 tablet  4  . potassium chloride (K-DUR) 10 MEQ tablet Take 1 tablet (10 mEq total) by mouth daily as needed. Take w/furosemide  30 tablet  6  . tiotropium (SPIRIVA HANDIHALER) 18 MCG inhalation capsule Place 1 capsule (18 mcg total) into inhaler and inhale daily.  30 capsule  6  . DISCONTD: roflumilast (DALIRESP) 500 MCG TABS tablet Take 1 tablet (500 mcg total) by mouth daily.  30 tablet  3  . levofloxacin (LEVAQUIN) 500 MG tablet Take 1 tablet (500 mg total) by mouth daily.  7 tablet  0  . predniSONE (STERAPRED UNI-PAK) 10 MG tablet Take 60mg  day 1 then taper by 10mg  daily  21 tablet  0   BP 130/62  Pulse 60  Temp 98.7 F (37.1 C) (Oral)  Ht 5\' 2"  (1.575 m)  Wt 100 lb 8 oz (45.587 kg)  BMI 18.38 kg/m2  SpO2 96%  Review of Systems  Constitutional: Positive for fatigue. Negative for fever, chills, appetite change and unexpected weight change.  HENT: Negative for ear pain, congestion, sore throat, trouble swallowing, neck pain, voice change and sinus pressure.   Eyes: Negative for visual disturbance.  Respiratory: Positive for cough and shortness of breath. Negative for wheezing and stridor.   Cardiovascular: Negative for chest pain, palpitations and leg swelling.  Gastrointestinal: Negative for nausea, vomiting, abdominal pain, diarrhea, constipation, blood in stool, abdominal distention and anal bleeding.  Genitourinary: Negative for dysuria and flank pain.  Musculoskeletal: Negative for myalgias, arthralgias and gait problem.  Skin: Negative for color change and rash.  Neurological: Negative for dizziness and headaches.  Hematological: Negative for adenopathy. Does not bruise/bleed easily.  Psychiatric/Behavioral: Negative for suicidal ideas, disturbed wake/sleep cycle and dysphoric mood. The patient is not nervous/anxious.         Objective:   Physical Exam  Constitutional: She is oriented to person, place, and time. She appears well-developed and well-nourished. No distress.  HENT:  Head: Normocephalic and atraumatic.  Right Ear: External ear normal.  Left Ear: External ear normal.  Nose: Nose normal.  Mouth/Throat: Oropharynx is clear and moist. No oropharyngeal exudate.  Eyes: Conjunctivae are normal. Pupils are equal, round, and reactive to light. Right eye exhibits no discharge. Left eye exhibits no discharge. No scleral icterus.  Neck: Normal range of motion. Neck supple. No tracheal deviation present. No thyromegaly present.  Cardiovascular: Normal rate, regular rhythm, normal heart sounds and intact distal pulses.  Exam reveals no gallop and no friction rub.   No murmur heard. Pulmonary/Chest: Effort normal. No accessory muscle usage. Not tachypneic. No respiratory distress. She has decreased breath sounds (prolonged exp phase). She has no wheezes. She has no rhonchi. She has no rales. She exhibits no tenderness.  Musculoskeletal: Normal range of motion. She exhibits no edema and no tenderness.  Lymphadenopathy:    She has no cervical adenopathy.  Neurological: She is alert and oriented to person, place, and time. No cranial nerve deficit. She exhibits normal muscle tone. Coordination normal.  Skin: Skin is warm and dry. No rash noted. She is not diaphoretic. No erythema. No pallor.  Psychiatric: She has a normal mood and affect. Her behavior is normal. Judgment and thought content normal.          Assessment & Plan:

## 2011-11-10 NOTE — Assessment & Plan Note (Signed)
Likely secondary to use of Daliresp. Will stop medication and recheck weight in one month.

## 2011-12-04 ENCOUNTER — Emergency Department: Payer: Self-pay | Admitting: Emergency Medicine

## 2011-12-04 ENCOUNTER — Telehealth: Payer: Self-pay | Admitting: *Deleted

## 2011-12-04 LAB — CK TOTAL AND CKMB (NOT AT ARMC)
CK, Total: 23 U/L (ref 21–215)
CK-MB: 1.9 ng/mL (ref 0.5–3.6)

## 2011-12-04 LAB — BASIC METABOLIC PANEL
Anion Gap: 5 — ABNORMAL LOW (ref 7–16)
BUN: 20 mg/dL — ABNORMAL HIGH (ref 7–18)
Calcium, Total: 8.8 mg/dL (ref 8.5–10.1)
Chloride: 99 mmol/L (ref 98–107)
EGFR (African American): >60
Glucose: 86 mg/dL (ref 65–99)
Osmolality: 283 (ref 275–301)
Potassium: 4.9 mmol/L (ref 3.5–5.1)

## 2011-12-04 LAB — CBC
HCT: 33 % — ABNORMAL LOW (ref 35.0–47.0)
HGB: 10.6 g/dL — ABNORMAL LOW (ref 12.0–16.0)
MCHC: 32.2 g/dL (ref 32.0–36.0)
Platelet: 406 10*3/uL (ref 150–440)
RBC: 3.71 10*6/uL — ABNORMAL LOW (ref 3.80–5.20)
RDW: 16.4 % — ABNORMAL HIGH (ref 11.5–14.5)
WBC: 10.6 10*3/uL (ref 3.6–11.0)

## 2011-12-04 LAB — TROPONIN I: Troponin-I: <0.02 ng/mL

## 2011-12-04 NOTE — Telephone Encounter (Signed)
Excellent advice. 

## 2011-12-04 NOTE — Telephone Encounter (Signed)
Daughter called stating that her mom's oxygen is around 75 today and is having a hard time breathing and overall doesn't feel well, I advised ER and daughter agreed that she would take her.

## 2011-12-11 ENCOUNTER — Other Ambulatory Visit: Payer: Self-pay | Admitting: Internal Medicine

## 2011-12-11 DIAGNOSIS — J449 Chronic obstructive pulmonary disease, unspecified: Secondary | ICD-10-CM

## 2011-12-11 MED ORDER — IPRATROPIUM BROMIDE 0.02 % IN SOLN: 500.0000 ug | Freq: Four times a day (QID) | RESPIRATORY_TRACT | Status: DC | PRN

## 2011-12-11 MED ORDER — ALBUTEROL SULFATE (2.5 MG/3ML) 0.083% IN NEBU: 2.5000 mg | INHALATION_SOLUTION | Freq: Four times a day (QID) | RESPIRATORY_TRACT | Status: AC | PRN

## 2011-12-11 NOTE — Telephone Encounter (Signed)
Patient needing refill on her Albuterol 2.5 mg and Ipratropium 0.2%. Patient is out of her medication.

## 2011-12-11 NOTE — Telephone Encounter (Signed)
Rx's sent to pharmacy, patient advised via telephone.

## 2011-12-17 ENCOUNTER — Ambulatory Visit (INDEPENDENT_AMBULATORY_CARE_PROVIDER_SITE_OTHER): Payer: Medicare Other | Admitting: Internal Medicine

## 2011-12-17 ENCOUNTER — Encounter: Payer: Self-pay | Admitting: Internal Medicine

## 2011-12-17 VITALS — BP 120/70 | HR 100 | Temp 98.7°F | Ht 62.0 in | Wt 104.8 lb

## 2011-12-17 DIAGNOSIS — E611 Iron deficiency: Secondary | ICD-10-CM

## 2011-12-17 DIAGNOSIS — F32A Depression, unspecified: Secondary | ICD-10-CM

## 2011-12-17 DIAGNOSIS — D509 Iron deficiency anemia, unspecified: Secondary | ICD-10-CM

## 2011-12-17 DIAGNOSIS — I10 Essential (primary) hypertension: Secondary | ICD-10-CM

## 2011-12-17 DIAGNOSIS — F329 Major depressive disorder, single episode, unspecified: Secondary | ICD-10-CM | POA: Insufficient documentation

## 2011-12-17 DIAGNOSIS — J449 Chronic obstructive pulmonary disease, unspecified: Secondary | ICD-10-CM

## 2011-12-17 MED ORDER — IPRATROPIUM BROMIDE 0.02 % IN SOLN: 500.0000 ug | Freq: Four times a day (QID) | RESPIRATORY_TRACT | Status: AC | PRN

## 2011-12-17 MED ORDER — PAROXETINE HCL 40 MG PO TABS: 40.0000 mg | ORAL_TABLET | ORAL | Status: DC

## 2011-12-17 MED ORDER — DILTIAZEM HCL ER 180 MG PO CP24: 180.0000 mg | ORAL_CAPSULE | Freq: Every day | ORAL | Status: AC

## 2011-12-17 MED ORDER — FERROUS SULFATE 325 (65 FE) MG PO TABS: 325.0000 mg | ORAL_TABLET | Freq: Every day | ORAL | Status: AC

## 2011-12-17 NOTE — Assessment & Plan Note (Signed)
Depression related to ongoing medical issues and family issues. Offered support today. Recommended family counseling. Patient declined for now. Will try increasing dose of Paxil to 40 mg daily. Followup one month.

## 2011-12-17 NOTE — Progress Notes (Signed)
Subjective:    Patient ID: Michele Hayes, female    DOB: December 06, 1925, 78 y.o.   MRN: [id number]  HPI 78 year old female with history of COPD, congestive heart failure, hypothyroidism, depression presents for followup. Her primary concern today is worsening depression. She reports significant family disagreements during her 2 daughters. She reports concern over the well being of her daughter who lives with her. She is also concerned about finances. She reports feeling fatigued and anxious. She sleeps frequently. She is often tearful.  In regards to COPD, she reports recent exacerbation requiring hospital stay. Notes regarding this hospitalization are not available at the time of dictation. Patient reports that she was placed on antibiotics and steroids with improvement in her symptoms. She feels that symptoms are back to baseline. She continues to use inhaled bronchodilators and steroids.  Outpatient Encounter Prescriptions as of 12/17/2011  Medication Sig Dispense Refill  . albuterol (PROVENTIL) (2.5 MG/3ML) 0.083% nebulizer solution Take 3 mLs (2.5 mg total) by nebulization every 6 (six) hours as needed for wheezing.  150 mL  6  . aspirin 81 MG tablet Take 81 mg by mouth daily.        . budesonide-formoterol (SYMBICORT) 160-4.5 MCG/ACT inhaler Inhale 2 puffs into the lungs 2 (two) times daily.  10.2 g  3  . cholecalciferol (VITAMIN D) 1000 UNITS tablet Take 1 tablet (1,000 Units total) by mouth daily.  30 tablet  3  . diltiazem (DILACOR XR) 180 MG 24 hr capsule Take 1 capsule (180 mg total) by mouth daily.  90 capsule  1  . ferrous sulfate (FERROUSUL) 325 (65 FE) MG tablet Take 1 tablet (325 mg total) by mouth daily with breakfast.  90 tablet  3  . furosemide (LASIX) 20 MG tablet Take 1 tablet (20 mg total) by mouth daily as needed (Swelling).  30 tablet  6  . ipratropium (ATROVENT) 0.02 % nebulizer solution Take 2.5 mLs (500 mcg total) by nebulization every 6 (six) hours as needed.  75 mL  6  .  levothyroxine (SYNTHROID, LEVOTHROID) 50 MCG tablet Take 1 tablet (50 mcg total) by mouth daily.  30 tablet  6  . lisinopril (PRINIVIL,ZESTRIL) 5 MG tablet Take 5 mg by mouth daily.        . metoprolol tartrate (LOPRESSOR) 25 MG tablet Take 1 tablet (25 mg total) by mouth daily.  30 tablet  6  . PARoxetine (PAXIL) 40 MG tablet Take 1 tablet (40 mg total) by mouth every morning.  30 tablet  4  . potassium chloride (K-DUR) 10 MEQ tablet Take 1 tablet (10 mEq total) by mouth daily as needed. Take w/furosemide  30 tablet  6  . tiotropium (SPIRIVA HANDIHALER) 18 MCG inhalation capsule Place 1 capsule (18 mcg total) into inhaler and inhale daily.  30 capsule  6   BP 120/70  Pulse 100  Temp 98.7 F (37.1 C) (Oral)  Ht 5\' 2"  (1.575 m)  Wt 104 lb 12 oz (47.514 kg)  BMI 19.16 kg/m2  SpO2 99%  Review of Systems  Constitutional: Positive for fatigue. Negative for fever, chills, appetite change and unexpected weight change.  HENT: Negative for ear pain, congestion, sore throat, trouble swallowing, neck pain, voice change and sinus pressure.   Eyes: Negative for visual disturbance.  Respiratory: Positive for cough and shortness of breath. Negative for wheezing and stridor.   Cardiovascular: Positive for leg swelling. Negative for chest pain and palpitations.  Gastrointestinal: Negative for nausea, vomiting, abdominal pain, diarrhea, constipation, blood in  stool, abdominal distention and anal bleeding.  Genitourinary: Negative for dysuria and flank pain.  Musculoskeletal: Negative for myalgias, arthralgias and gait problem.  Skin: Negative for color change and rash.  Neurological: Negative for dizziness and headaches.  Hematological: Negative for adenopathy. Does not bruise/bleed easily.  Psychiatric/Behavioral: Positive for dysphoric mood. Negative for suicidal ideas and disturbed wake/sleep cycle. The patient is nervous/anxious.        Objective:   Physical Exam  Constitutional: She is oriented  to person, place, and time. She appears well-developed and well-nourished. She has a sickly appearance. No distress.  HENT:  Head: Normocephalic and atraumatic.  Right Ear: External ear normal.  Left Ear: External ear normal.  Nose: Nose normal.  Mouth/Throat: Oropharynx is clear and moist. No oropharyngeal exudate.  Eyes: Conjunctivae are normal. Pupils are equal, round, and reactive to light. Right eye exhibits no discharge. Left eye exhibits no discharge. No scleral icterus.  Neck: Normal range of motion. Neck supple. No tracheal deviation present. No thyromegaly present.  Cardiovascular: Normal rate, regular rhythm, normal heart sounds and intact distal pulses.  Exam reveals no gallop and no friction rub.   No murmur heard. Pulmonary/Chest: Effort normal. No accessory muscle usage. Not tachypneic. No respiratory distress. She has decreased breath sounds. She has no wheezes. She has rhonchi (scattered, rare). She has no rales. She exhibits no tenderness.  Musculoskeletal: Normal range of motion. She exhibits no edema and no tenderness.  Lymphadenopathy:    She has no cervical adenopathy.  Neurological: She is alert and oriented to person, place, and time. No cranial nerve deficit. She exhibits normal muscle tone. Coordination normal.  Skin: Skin is warm and dry. No rash noted. She is not diaphoretic. No erythema. No pallor.  Psychiatric: Her behavior is normal. Judgment and thought content normal. She exhibits a depressed mood.          Assessment & Plan:

## 2011-12-17 NOTE — Assessment & Plan Note (Signed)
Symptoms severe with frequent exacerbations. Unable to tolerate Daliresp because of worsening anxiety. Will continue inhaled bronchodilators and steroids. Followup in 4 weeks.

## 2011-12-18 ENCOUNTER — Telehealth: Payer: Self-pay | Admitting: Internal Medicine

## 2011-12-18 NOTE — Telephone Encounter (Signed)
Chest x-ray performed at the hospital on 12/04/2011 showed infiltrate consistent with infection and some scarring. Patient's daughter requested that we call with these results.

## 2011-12-19 NOTE — Telephone Encounter (Signed)
Patients daughter called stating that her mom oxygen level has been a little lower and she is asking for a concentrator for the lower level of their home.  They have one upstairs but patient doesn't want to be confined to one area of the house.  Can we send an order to [facility] for a second concentrator?  Please advise.

## 2011-12-19 NOTE — Telephone Encounter (Signed)
That is fine 

## 2011-12-19 NOTE — Telephone Encounter (Signed)
Patient advised as instructed via telephone, she will have her daughter call back.

## 2011-12-31 ENCOUNTER — Telehealth: Payer: Self-pay | Admitting: Internal Medicine

## 2011-12-31 ENCOUNTER — Emergency Department: Payer: Self-pay | Admitting: Emergency Medicine

## 2011-12-31 LAB — CBC WITH DIFFERENTIAL/PLATELET
Basophil %: 0.3 %
Eosinophil #: 0.1 10*3/uL (ref 0.0–0.7)
Eosinophil %: 0.5 %
Lymphocyte %: 5.5 %
MCV: 90 fL (ref 80–100)
Monocyte %: 9.3 %
Neutrophil %: 84.4 %
Platelet: 341 10*3/uL (ref 150–440)
RBC: 3.91 10*6/uL (ref 3.80–5.20)
RDW: 16.4 % — ABNORMAL HIGH (ref 11.5–14.5)
WBC: 16.1 10*3/uL — ABNORMAL HIGH (ref 3.6–11.0)

## 2011-12-31 LAB — URINALYSIS, COMPLETE
Glucose,UR: NEGATIVE mg/dL (ref 0–75)
Hyaline Cast: 4
Nitrite: NEGATIVE
Ph: 5 (ref 4.5–8.0)
Protein: 30
Specific Gravity: 1.023 (ref 1.003–1.030)
WBC UR: 24 /HPF (ref 0–5)

## 2011-12-31 LAB — COMPREHENSIVE METABOLIC PANEL
Albumin: 2.7 g/dL — ABNORMAL LOW (ref 3.4–5.0)
Alkaline Phosphatase: 96 U/L (ref 50–136)
BUN: 17 mg/dL (ref 7–18)
Bilirubin,Total: 0.2 mg/dL (ref 0.2–1.0)
Chloride: 94 mmol/L — ABNORMAL LOW (ref 98–107)
Creatinine: 0.4 mg/dL — ABNORMAL LOW (ref 0.60–1.30)
EGFR (African American): >60
EGFR (Non-African Amer.): >60
Glucose: 102 mg/dL — ABNORMAL HIGH (ref 65–99)
SGOT(AST): 23 U/L (ref 15–37)
SGPT (ALT): 13 U/L (ref 12–78)
Total Protein: 6.9 g/dL (ref 6.4–8.2)

## 2011-12-31 LAB — TROPONIN I: Troponin-I: <0.02 ng/mL

## 2011-12-31 NOTE — Telephone Encounter (Signed)
Caller: Vickie/Child; Patient Name: Michele Hayes; PCP: ; Janith Lima Phone Number: [phone]; Reason for call: Other THE PATIENT REFUSED 911 Calling regarding hallucinating, drowsy, cannot keep eyes open, onset of symptoms 12/24/11. Wears oxygen at all times. Patient is at home. She is confused, mouth is dry, slurred speech. Chip Boer who is the daughter is at the office now wanting to speak with someone about patients condition. Emergent signs and symptoms ruled out as per Confusion, Disorientation, Agitation protocol except for see in the Emergency Room immediately due to hearing voices that no one else hears or seeing things that no one else does. Spoke with Lowella Bandy at the office and states she will talk to daughter. Notified daughter of this and needs to be seen in the Emergency Room and compliant.

## 2012-01-01 ENCOUNTER — Telehealth: Payer: Self-pay | Admitting: *Deleted

## 2012-01-01 NOTE — Telephone Encounter (Signed)
Fine to cut back to 20mg  daily. Pt needs to be seen if having hallucinations.

## 2012-01-01 NOTE — Telephone Encounter (Signed)
Received fax from pharmacy stating that patient is taking Paroxetine 40mg .  Patient is having hallucinations and pharmacist wants to know if Dr. Dan Humphreys wants to cut it back to 20mg ?  Please advise.

## 2012-01-02 NOTE — Telephone Encounter (Signed)
Patient daughter advised as instructed via telephone, she stated that her mom is doing better, no hallucinations.  She has a f/u appt with Dr. Dan Humphreys on 01/05/2012 at 8:30.  I also advised daughter that patient is to take Paxil 20mg  every morning instead of 40mg , medication updated in epic.

## 2012-01-02 NOTE — Telephone Encounter (Signed)
Michele Hayes was advised here in the office on 12/31/2011 that they need to take patient to the ED for evaluation.  Michele Hayes agreed and stated that she would go home to get her mom and her sister and they would go to the ED.

## 2012-01-05 ENCOUNTER — Encounter: Payer: Self-pay | Admitting: Internal Medicine

## 2012-01-05 ENCOUNTER — Ambulatory Visit (INDEPENDENT_AMBULATORY_CARE_PROVIDER_SITE_OTHER): Payer: Medicare Other | Admitting: Internal Medicine

## 2012-01-05 VITALS — BP 130/90 | HR 82 | Temp 98.4°F | Ht 62.0 in | Wt 102.5 lb

## 2012-01-05 DIAGNOSIS — R0600 Dyspnea, unspecified: Secondary | ICD-10-CM

## 2012-01-05 DIAGNOSIS — F329 Major depressive disorder, single episode, unspecified: Secondary | ICD-10-CM

## 2012-01-05 DIAGNOSIS — J449 Chronic obstructive pulmonary disease, unspecified: Secondary | ICD-10-CM

## 2012-01-05 DIAGNOSIS — I509 Heart failure, unspecified: Secondary | ICD-10-CM

## 2012-01-05 DIAGNOSIS — R230 Cyanosis: Secondary | ICD-10-CM

## 2012-01-05 DIAGNOSIS — R0609 Other forms of dyspnea: Secondary | ICD-10-CM

## 2012-01-05 MED ORDER — MORPHINE SULFATE (CONCENTRATE) 20 MG/ML PO SOLN: 5.0000 mg | ORAL | Status: AC | PRN

## 2012-01-05 NOTE — Assessment & Plan Note (Signed)
Secondary to congestive heart failure and COPD. Patient would prefer to pursue comfort care rather than aggressively treat with diuretics and other medications. Will use morphine as needed for dyspnea. Will set up hospice evaluation.

## 2012-01-05 NOTE — Assessment & Plan Note (Signed)
Symptoms gradually progressive. Rales noted on exam today. Patient would prefer not to use Lasix because it is so cumbersome for her to repeatedly use the restroom. As above, will start morphine for dyspnea and set up hospice care.

## 2012-01-05 NOTE — Assessment & Plan Note (Signed)
Symptoms of depression poorly controlled with Paxil but patient would prefer not to add additional medications at this time. Will set up counseling through hospice.

## 2012-01-05 NOTE — Assessment & Plan Note (Signed)
Symptoms severe and gradually progressive. We'll continue current medications. Will add morphine as needed for episodes of dyspnea. Will set up hospice care.

## 2012-01-05 NOTE — Progress Notes (Addendum)
Subjective:    Patient ID: Michele Hayes, female    DOB: 08/13/1925, 78 y.o.   MRN: [id number]  HPI 78 year old female with history of COPD and congestive heart failure presents for followup after recent hospital evaluation for confusion and altered mental status. Her daughter reports that frequently during the night, her oxygen falls off in her oxygen saturation drops as low as into the 50s. During these times, she is increasingly confused and often sees or hears things that are present. She occasionally has hallucinations during the day as well. During her recent hospital evaluation she had CT of the head which was normal. Lab work was also normal. Both of her daughters have noted a progressive decline in her overall functional status. They're interested in setting up hospice care.  Patient reports increasing frequency of episodes of shortness of breath and dyspnea. She would like to use medications to help with this. She denies any fever, chills, chest pain. She reports some anxiety associated with these episodes of dyspnea.  Outpatient Encounter Prescriptions as of 01/05/2012  Medication Sig Dispense Refill  . albuterol (PROVENTIL) (2.5 MG/3ML) 0.083% nebulizer solution Take 3 mLs (2.5 mg total) by nebulization every 6 (six) hours as needed for wheezing.  150 mL  6  . aspirin 81 MG tablet Take 81 mg by mouth daily.        . budesonide-formoterol (SYMBICORT) 160-4.5 MCG/ACT inhaler Inhale 2 puffs into the lungs 2 (two) times daily.  10.2 g  3  . cholecalciferol (VITAMIN D) 1000 UNITS tablet Take 1 tablet (1,000 Units total) by mouth daily.  30 tablet  3  . diltiazem (DILACOR XR) 180 MG 24 hr capsule Take 1 capsule (180 mg total) by mouth daily.  90 capsule  1  . ferrous sulfate (FERROUSUL) 325 (65 FE) MG tablet Take 1 tablet (325 mg total) by mouth daily with breakfast.  90 tablet  3  . furosemide (LASIX) 20 MG tablet Take 1 tablet (20 mg total) by mouth daily as needed (Swelling).  30 tablet  6  .  ipratropium (ATROVENT) 0.02 % nebulizer solution Take 2.5 mLs (500 mcg total) by nebulization every 6 (six) hours as needed.  75 mL  6  . levothyroxine (SYNTHROID, LEVOTHROID) 50 MCG tablet Take 1 tablet (50 mcg total) by mouth daily.  30 tablet  6  . lisinopril (PRINIVIL,ZESTRIL) 5 MG tablet Take 5 mg by mouth daily.        . metoprolol tartrate (LOPRESSOR) 25 MG tablet Take 1 tablet (25 mg total) by mouth daily.  30 tablet  6  . PARoxetine (PAXIL) 20 MG tablet Take 20 mg by mouth every morning.      . potassium chloride (K-DUR) 10 MEQ tablet Take 1 tablet (10 mEq total) by mouth daily as needed. Take w/furosemide  30 tablet  6  . tiotropium (SPIRIVA HANDIHALER) 18 MCG inhalation capsule Place 1 capsule (18 mcg total) into inhaler and inhale daily.  30 capsule  6  . morphine (ROXANOL) 20 MG/ML concentrated solution Take 0.25 mLs (5 mg total) by mouth every 2 (two) hours as needed (dyspnea).  120 mL  0    Review of Systems  Constitutional: Positive for activity change, appetite change and fatigue. Negative for fever, chills and unexpected weight change.  HENT: Negative for ear pain, congestion, sore throat, trouble swallowing, neck pain, voice change and sinus pressure.   Eyes: Negative for visual disturbance.  Respiratory: Positive for shortness of breath. Negative for cough, wheezing and  stridor.   Cardiovascular: Negative for chest pain, palpitations and leg swelling.  Gastrointestinal: Negative for nausea, vomiting, abdominal pain, diarrhea, constipation, blood in stool, abdominal distention and anal bleeding.  Genitourinary: Negative for dysuria and flank pain.  Musculoskeletal: Negative for myalgias, arthralgias and gait problem.  Skin: Negative for color change and rash.  Neurological: Negative for dizziness and headaches.  Hematological: Negative for adenopathy. Does not bruise/bleed easily.  Psychiatric/Behavioral: Positive for behavioral problems, confusion, disturbed wake/sleep cycle  and dysphoric mood. Negative for suicidal ideas. The patient is nervous/anxious.    BP 130/90  Pulse 82  Temp 98.4 F (36.9 C) (Oral)  Ht 5\' 2"  (1.575 m)  Wt 102 lb 8 oz (46.494 kg)  BMI 18.75 kg/m2  SpO2 91%     Objective:   Physical Exam  Constitutional: She is oriented to person, place, and time. She appears well-developed and well-nourished. She appears cachectic. She has a sickly appearance. No distress.  HENT:  Head: Normocephalic and atraumatic.  Right Ear: External ear normal.  Left Ear: External ear normal.  Nose: Nose normal.  Mouth/Throat: Oropharynx is clear and moist. No oropharyngeal exudate.  Eyes: Conjunctivae are normal. Pupils are equal, round, and reactive to light. Right eye exhibits no discharge. Left eye exhibits no discharge. No scleral icterus.  Neck: Normal range of motion. Neck supple. No tracheal deviation present. No thyromegaly present.  Cardiovascular: Normal rate, regular rhythm, normal heart sounds and intact distal pulses.  Exam reveals no gallop and no friction rub.   No murmur heard. Pulmonary/Chest: Effort normal. No accessory muscle usage. Not tachypneic. No respiratory distress. She has decreased breath sounds. She has no wheezes. She has no rhonchi. She has rales (bilateral bases). She exhibits no tenderness.  Musculoskeletal: Normal range of motion. She exhibits no edema and no tenderness.  Lymphadenopathy:    She has no cervical adenopathy.  Neurological: She is alert and oriented to person, place, and time. No cranial nerve deficit. She exhibits normal muscle tone. Coordination normal.  Skin: Skin is warm and dry. No rash noted. She is not diaphoretic. No erythema. No pallor.  Psychiatric: Her speech is normal and behavior is normal. Judgment and thought content normal. Her mood appears anxious. Cognition and memory are normal. She exhibits a depressed mood.          Assessment & Plan:

## 2012-01-09 ENCOUNTER — Telehealth: Payer: Self-pay | Admitting: *Deleted

## 2012-01-09 NOTE — Telephone Encounter (Signed)
Michele Hayes called to report that patient passed away last night at 11:23 pm.

## 2012-01-11 DEATH — deceased

## 2012-01-21 ENCOUNTER — Ambulatory Visit: Payer: Medicare Other | Admitting: Internal Medicine

## 2012-02-04 ENCOUNTER — Ambulatory Visit: Payer: Medicare Other | Admitting: Internal Medicine

## 2013-02-23 IMAGING — CR DG SHOULDER 1V*L*
1 series · 3 of 3 positions shown · non-contrast
Comparison: none

REASON FOR EXAM: trauma, possible fracture dislocation
COMMENTS:   Bedside (portable):Y

PROCEDURE:     DXR - DXR SHOULDER LEFT ONE VIEW  - January 14, 2011  [DATE]
RESULT:     Comparison: None.

[Series 1: view not recorded · 0.17mm/px · 3 of 3 slices shown]
[im 1/3]
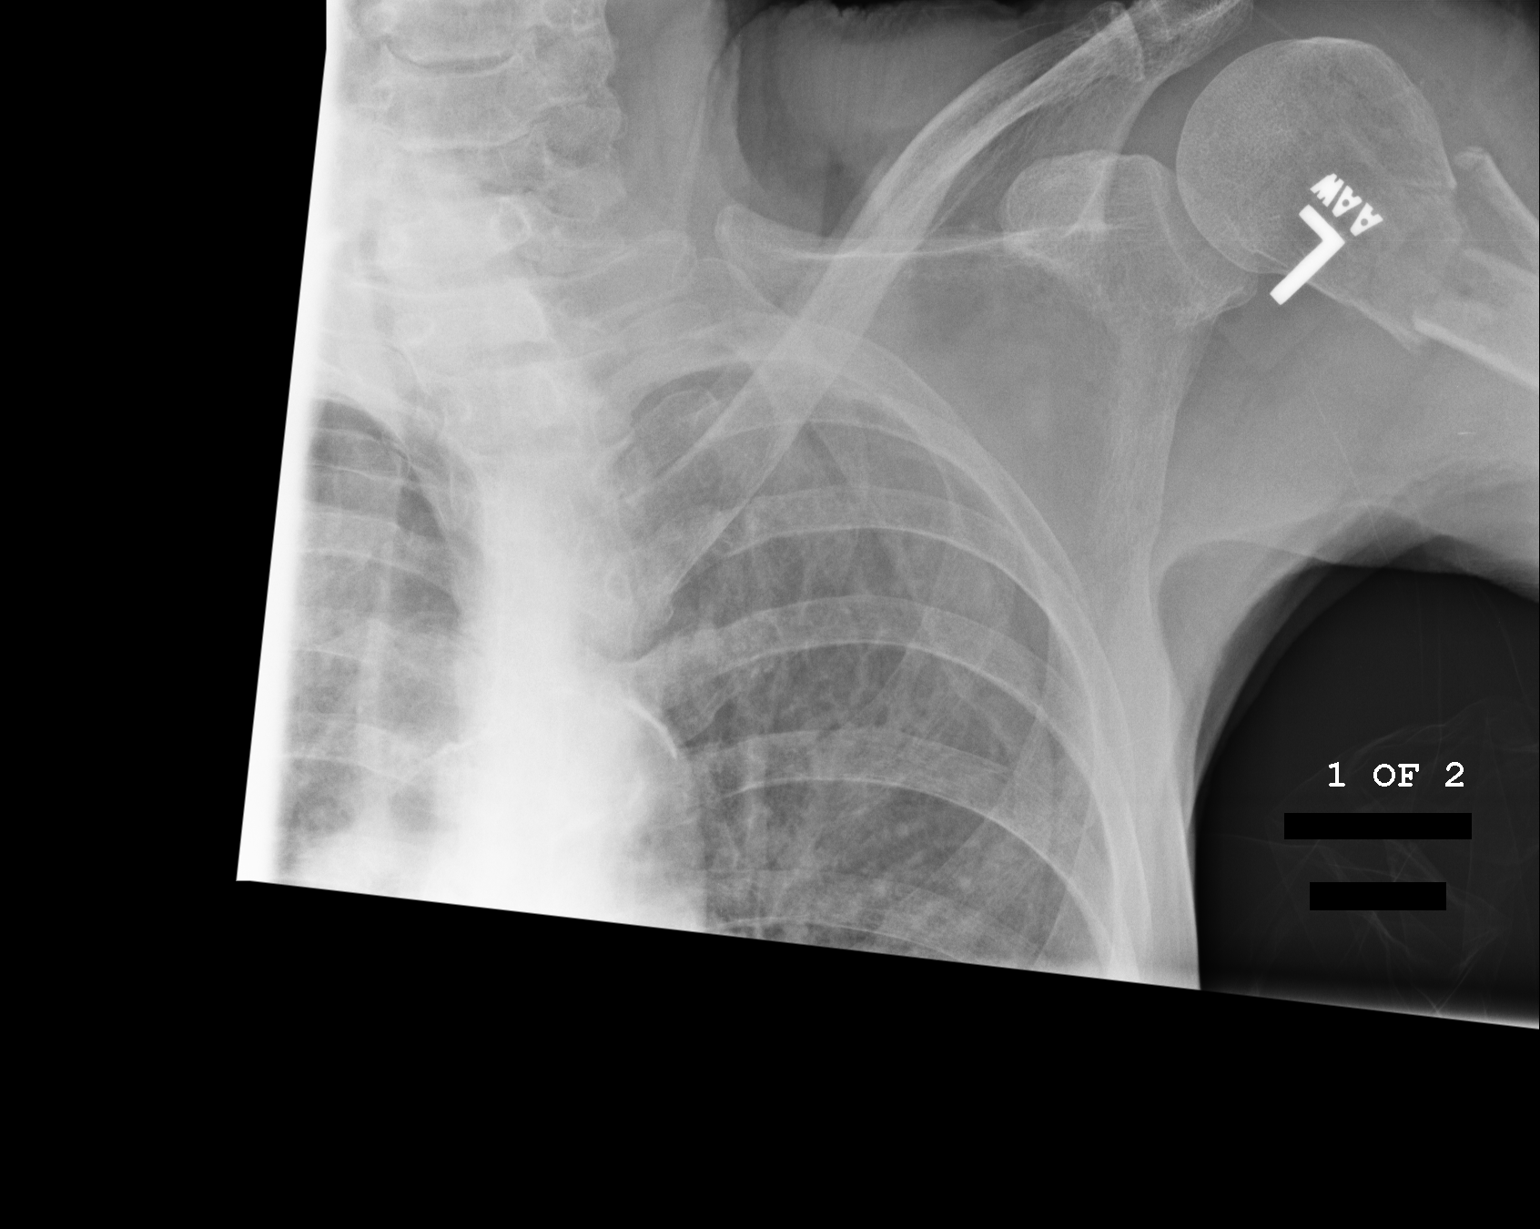
[im 2/3]
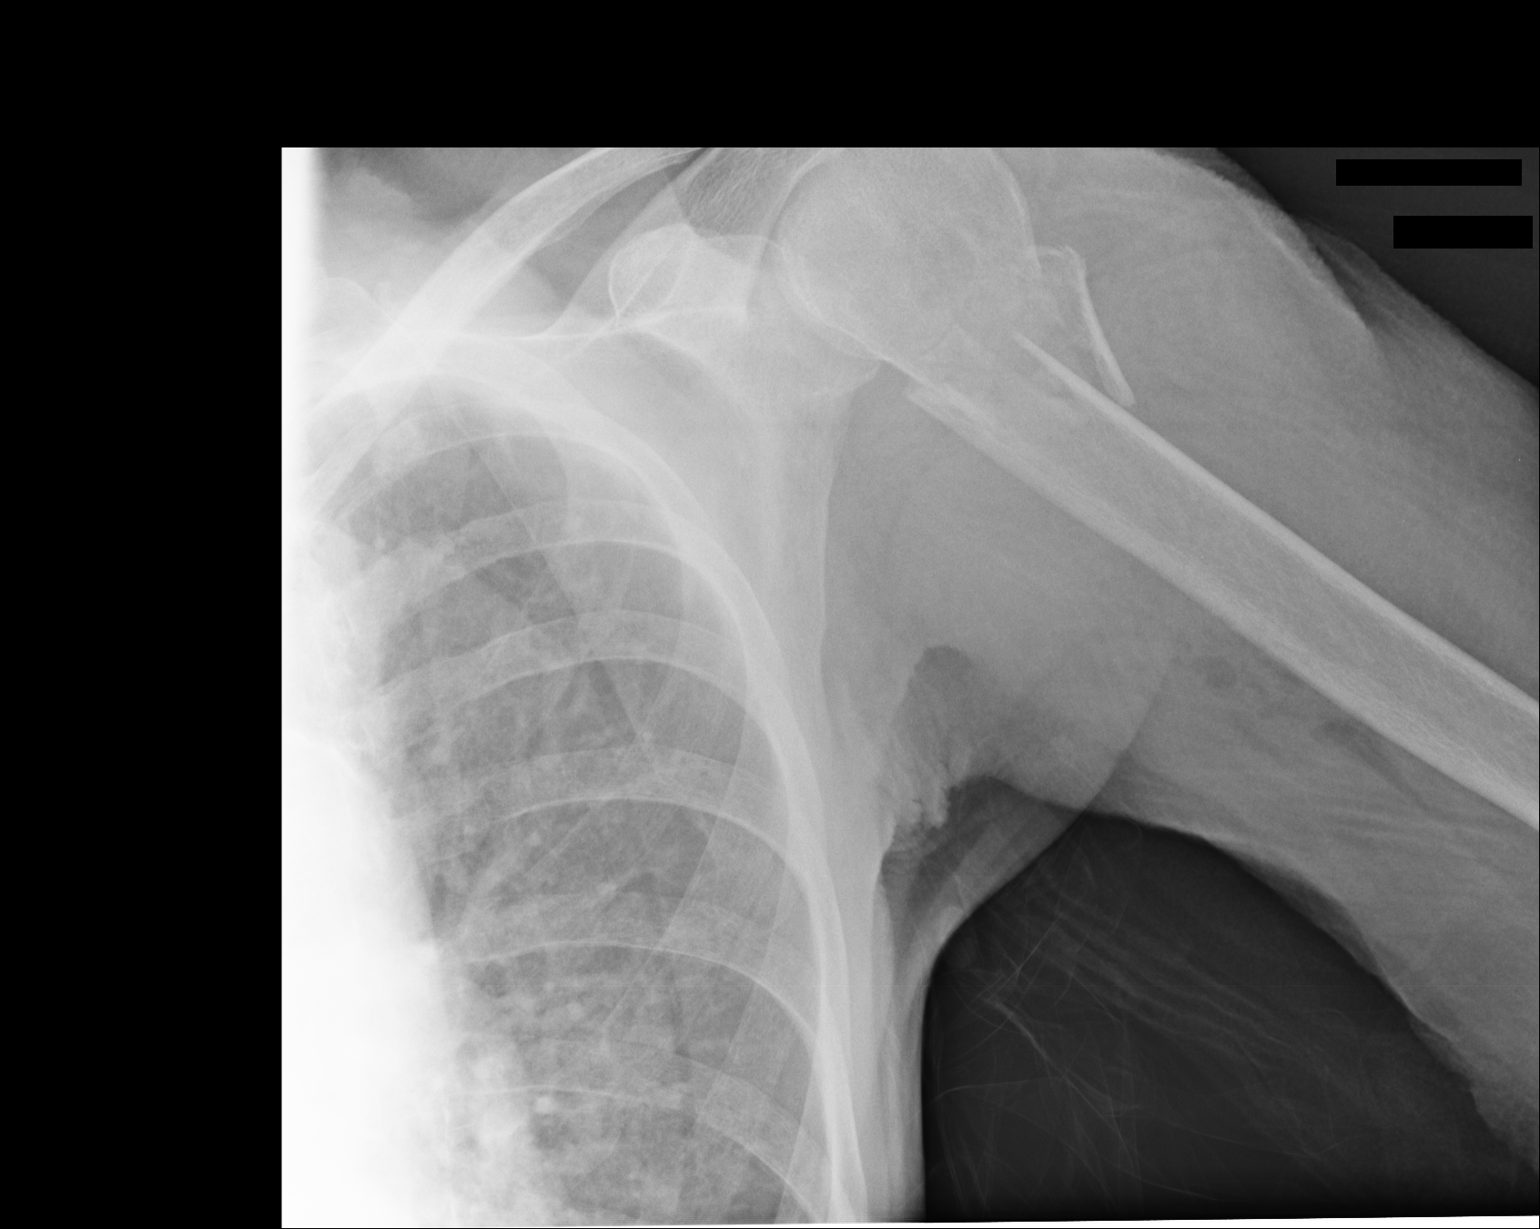
[im 3/3]
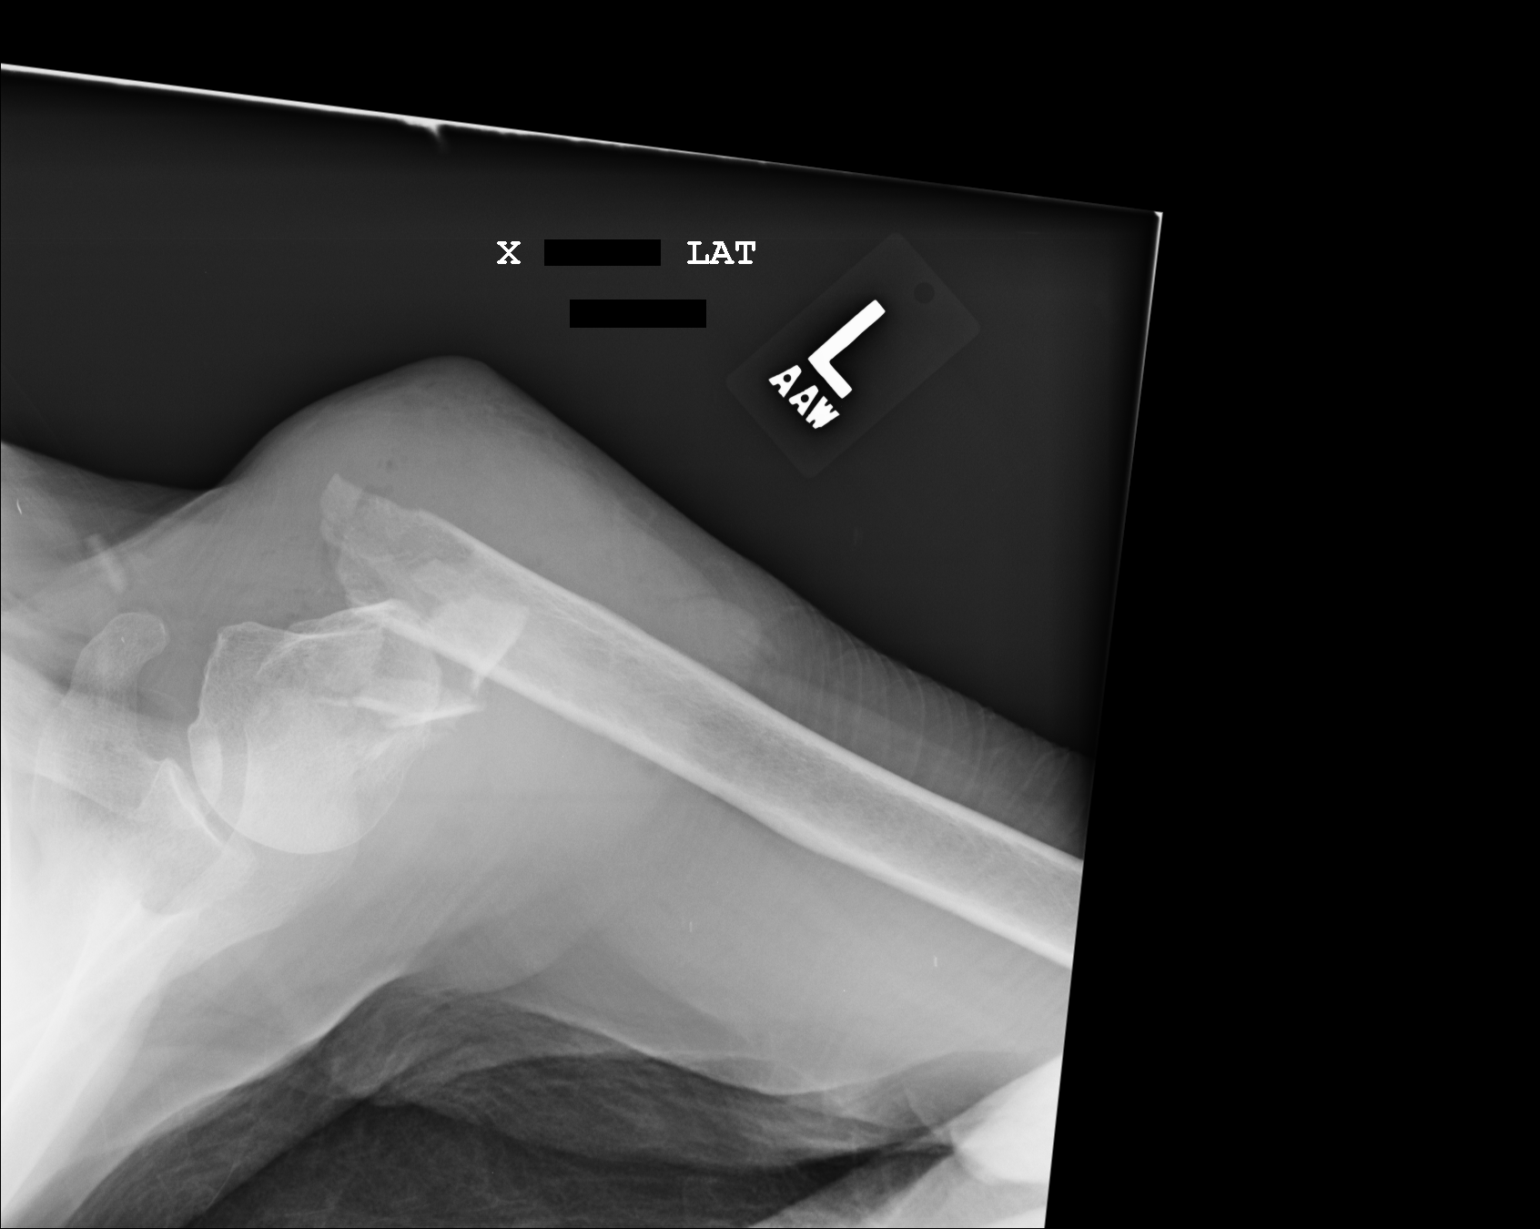

[3 of 3 positions shown; findings below may reference images not displayed]

FINDINGS: There is a displaced, comminuted fracture of the humeral head and neck
junction. There is apex anterior angulation at the fracture site.
IMPRESSION: Comminuted, displaced fracture of the humeral head and neck junction.

## 2013-05-18 IMAGING — CR DG CHEST 2V
1 series · 2 of 2 positions shown · non-contrast
Comparison: none

REASON FOR EXAM: sob
COMMENTS:   May transport without cardiac monitor

PROCEDURE:     DXR - DXR CHEST PA (OR AP) AND LATERAL  - April 08, 2011 [DATE]
RESULT:     Comparison: 01/14/2011 and CT of the chest 10/04/1908

[Series 1: view not recorded · 0.17mm/px · 2 of 2 slices shown]
[im 1/2]
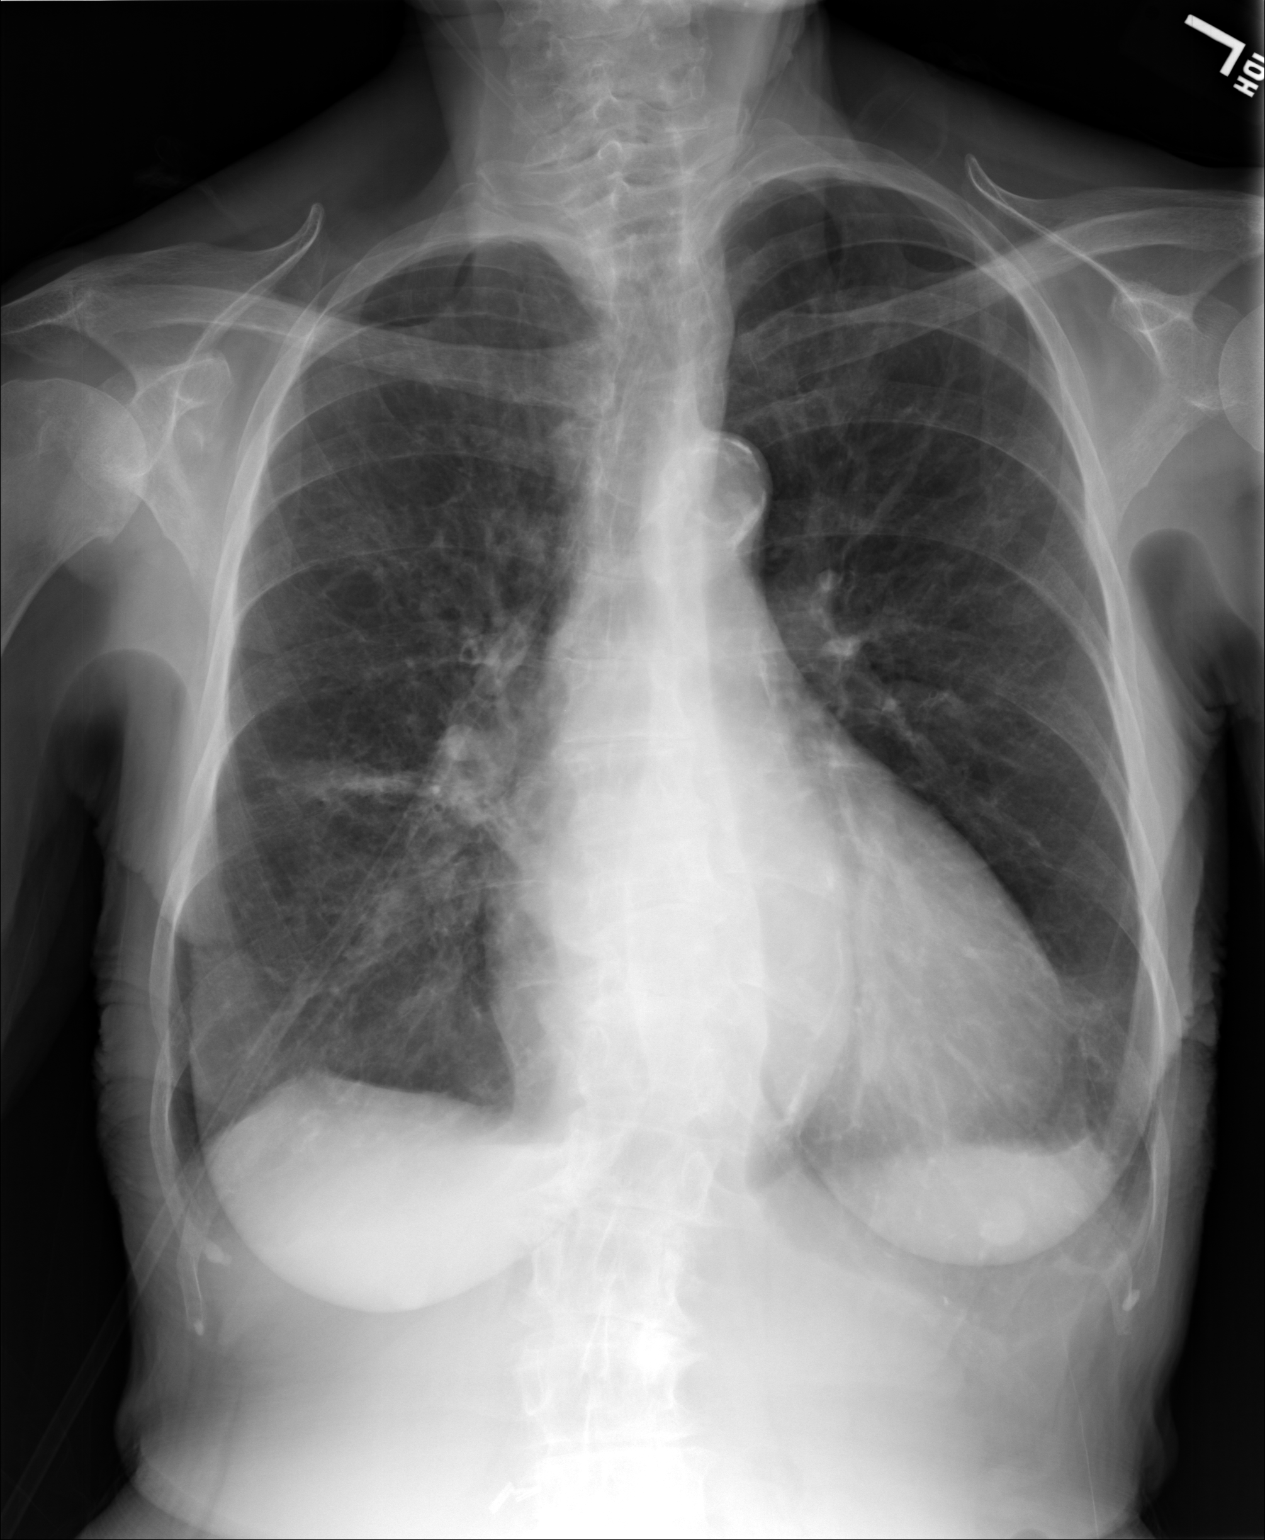
[im 2/2]
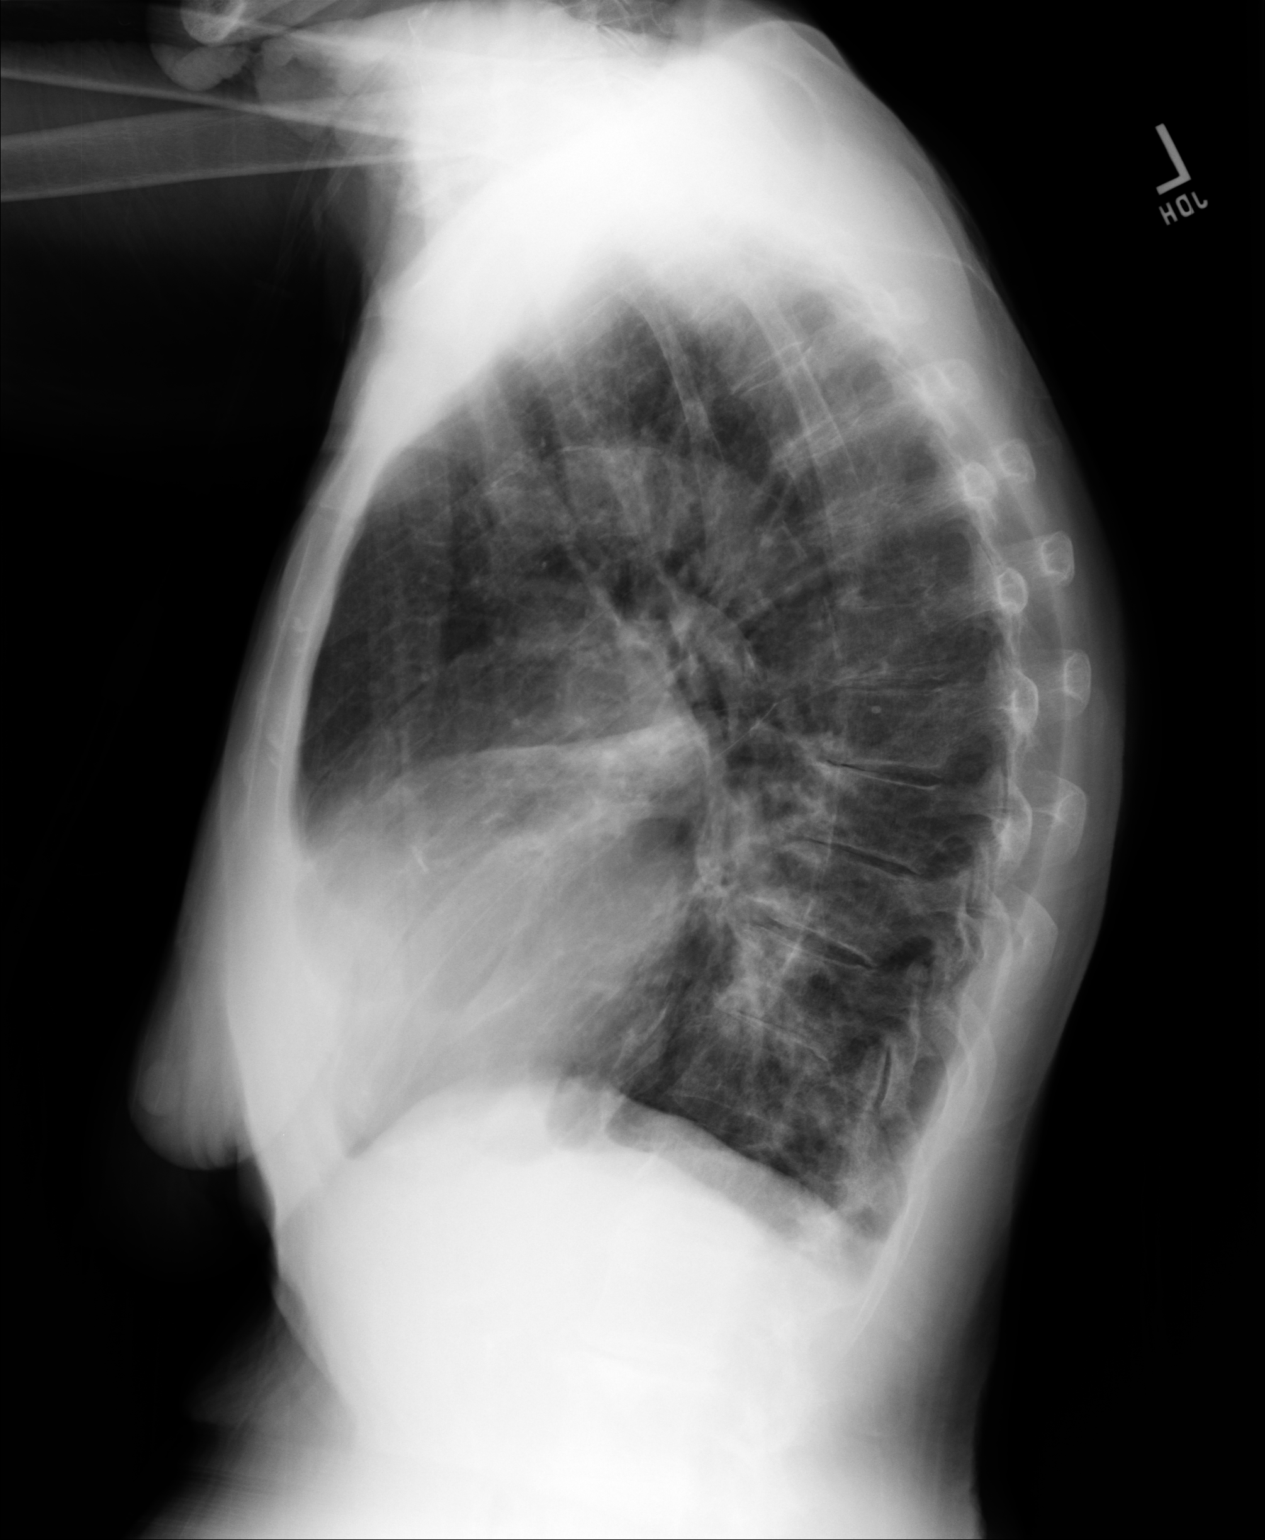

[2 of 2 positions shown; findings below may reference images not displayed]

FINDINGS: Cardiomegaly and tortuous aorta are similar to prior. There are mild
opacities in the right middle lobe, near the right hilum. This is likely
related to atelectasis, as seen on the prior CT. The lungs are hyperinflated.
IMPRESSION: Persistent atelectasis in the right middle lobe. A centrally obstructing
lesion is not excluded. Bronchoscopy is suggested, if not already performed.

## 2013-11-12 IMAGING — CR DG CHEST 2V
1 series · 2 of 2 positions shown · non-contrast
Comparison: none

REASON FOR EXAM: PNA
COMMENTS:

[Series 1: w chest pa · 0.14mm/px · 2 of 2 slices shown]
[im 1/2]
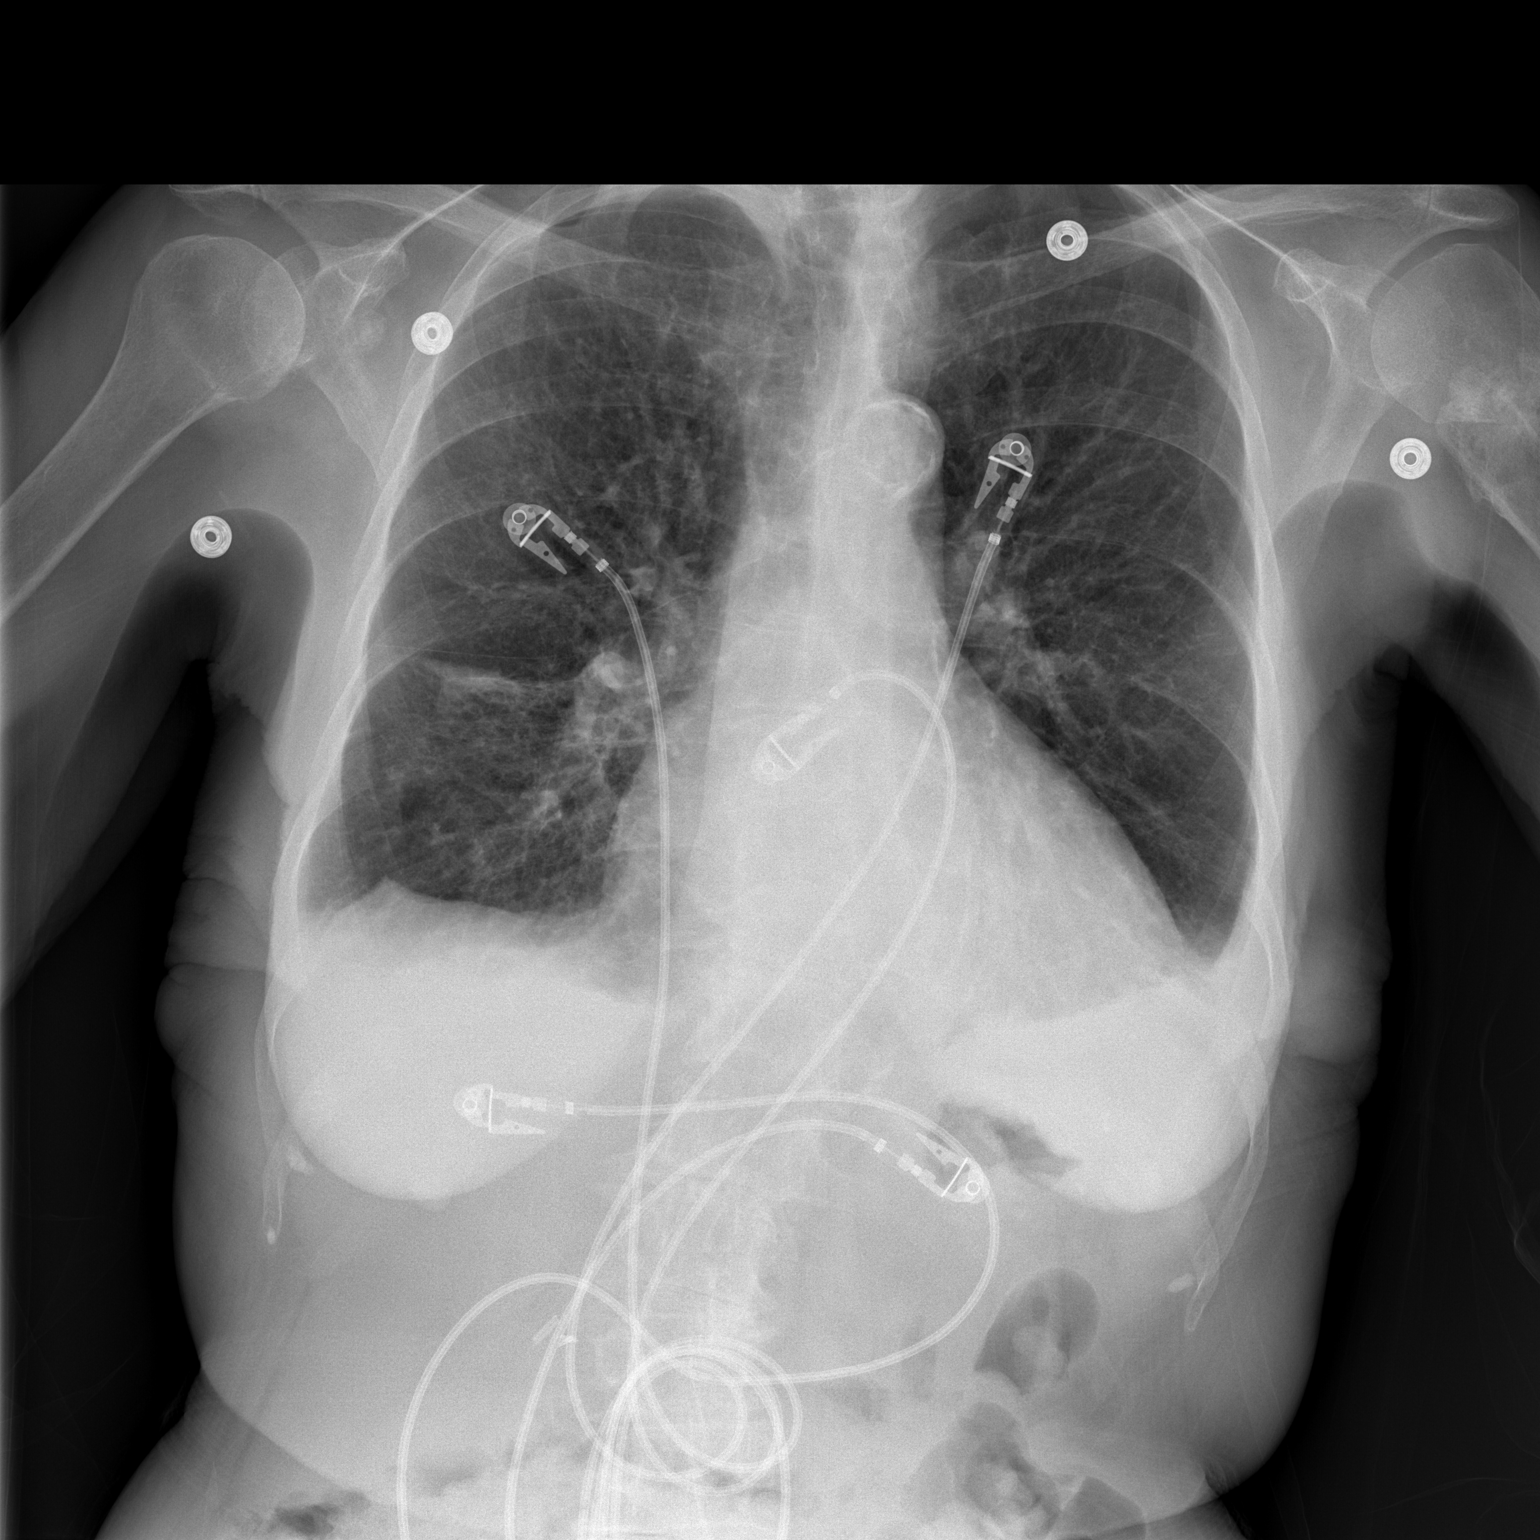
[im 2/2]
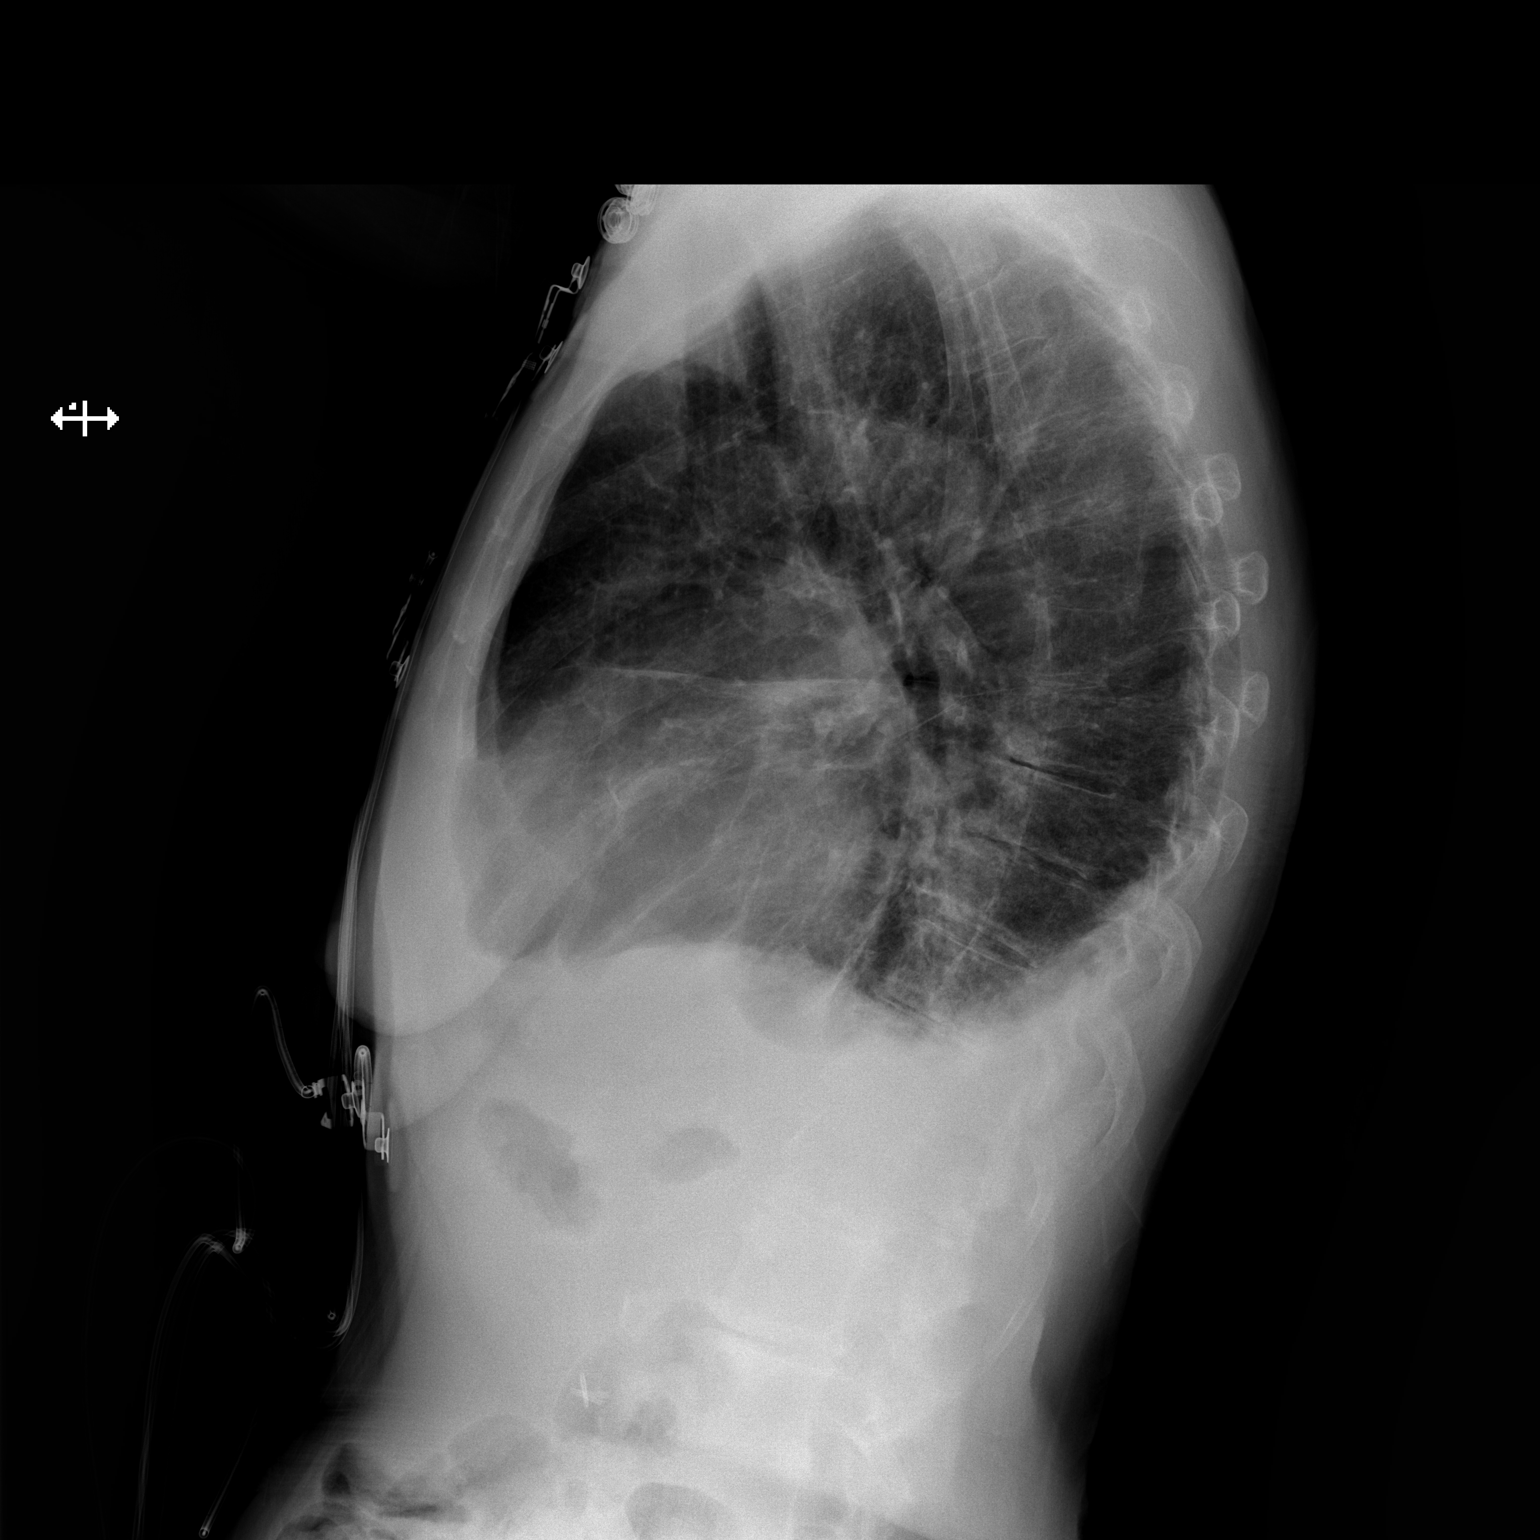

[2 of 2 positions shown; findings below may reference images not displayed]

PROCEDURE:     DXR - DXR CHEST PA (OR AP) AND LATERAL  - October 03, 2011  [DATE]

RESULT:     Comparison is made to the study 01 October, 2011.

There is increased blunting of the costophrenic angles bilaterally. The
cardiac silhouette remains enlarged. The pulmonary interstitial markings
remain increased. There is likely fluid in the minor fissure on the right.
IMPRESSION: The findings are consistent with congestive heart failure
with interstitial edema and bilateral pleural effusions. The appearance of
the chest has deteriorated somewhat since yesterday's study. There is likely
underlying COPD.

[REDACTED]

## 2014-01-13 IMAGING — CR DG CHEST 2V
1 series · 2 of 2 positions shown · non-contrast
Comparison: none

REASON FOR EXAM: sob, hypoxia
COMMENTS:

[Series 1: x chest ap · 0.14mm/px · 2 of 2 slices shown]
[im 1/2]
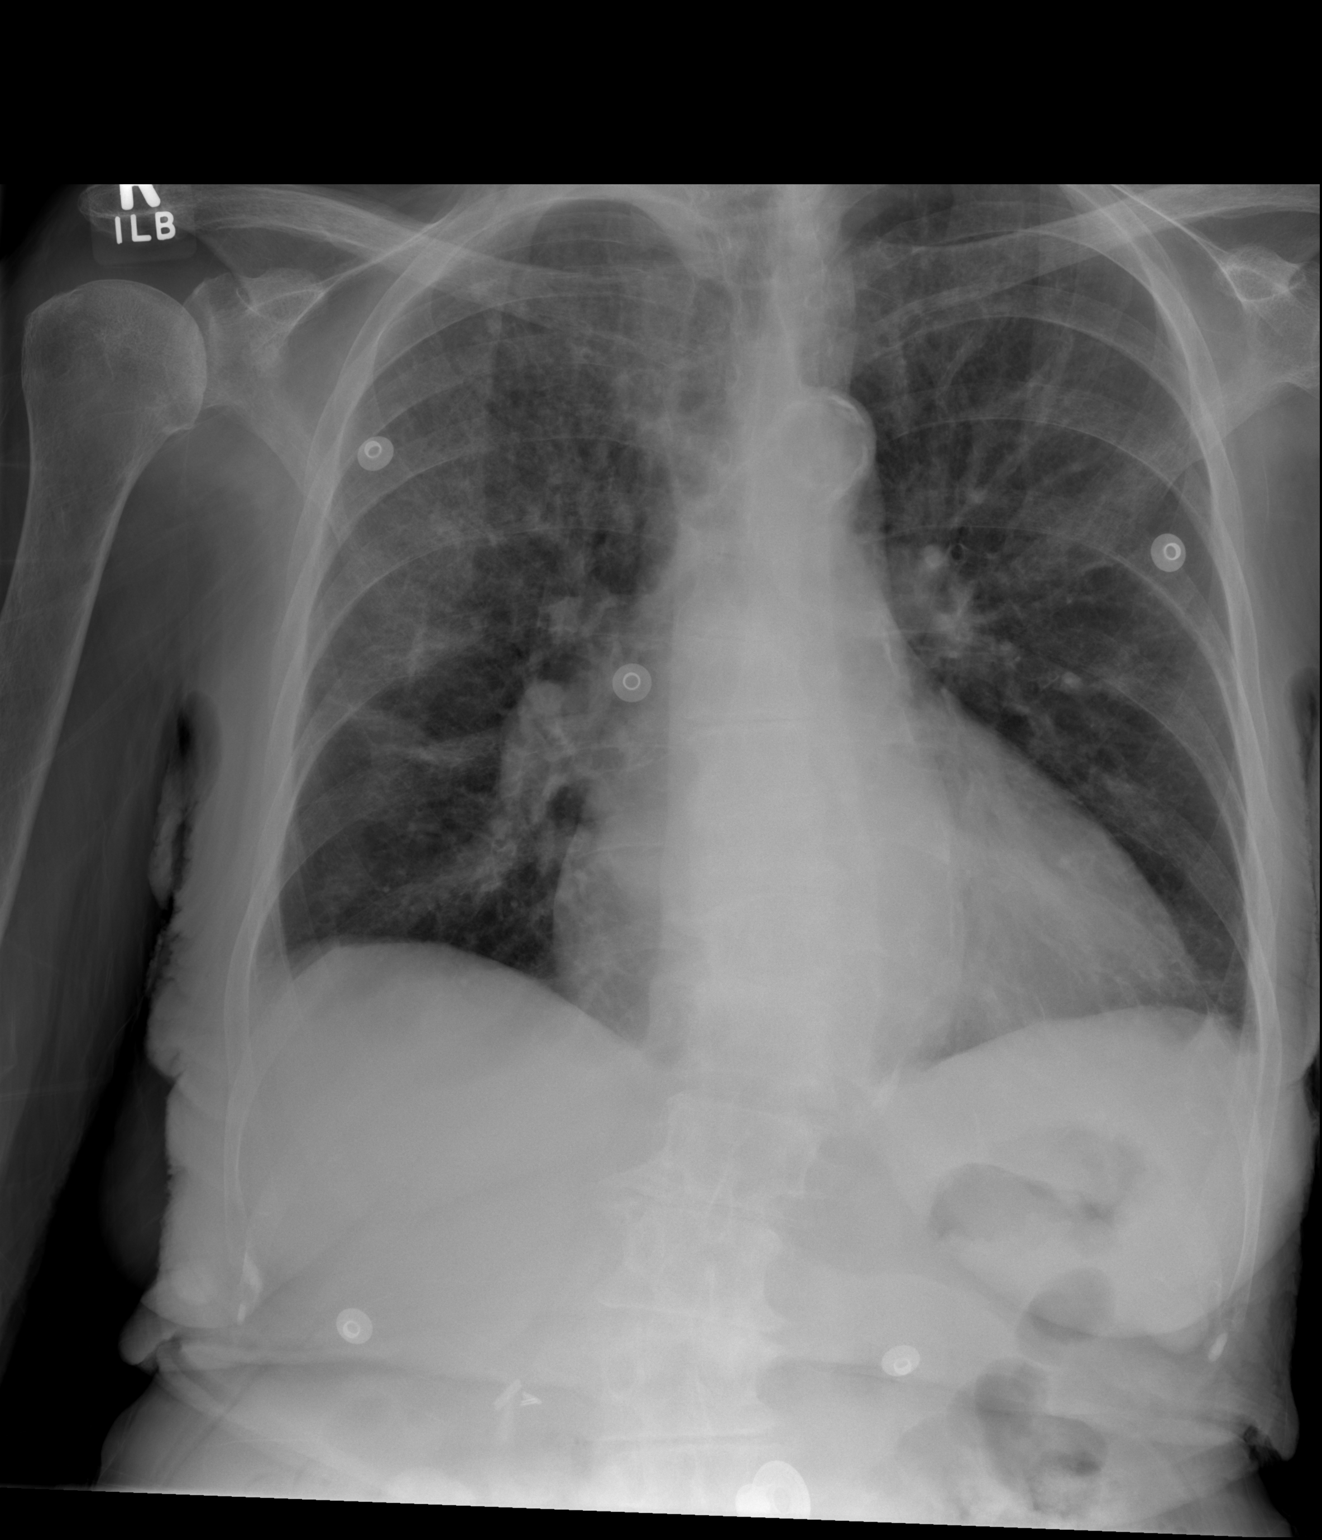
[im 2/2]
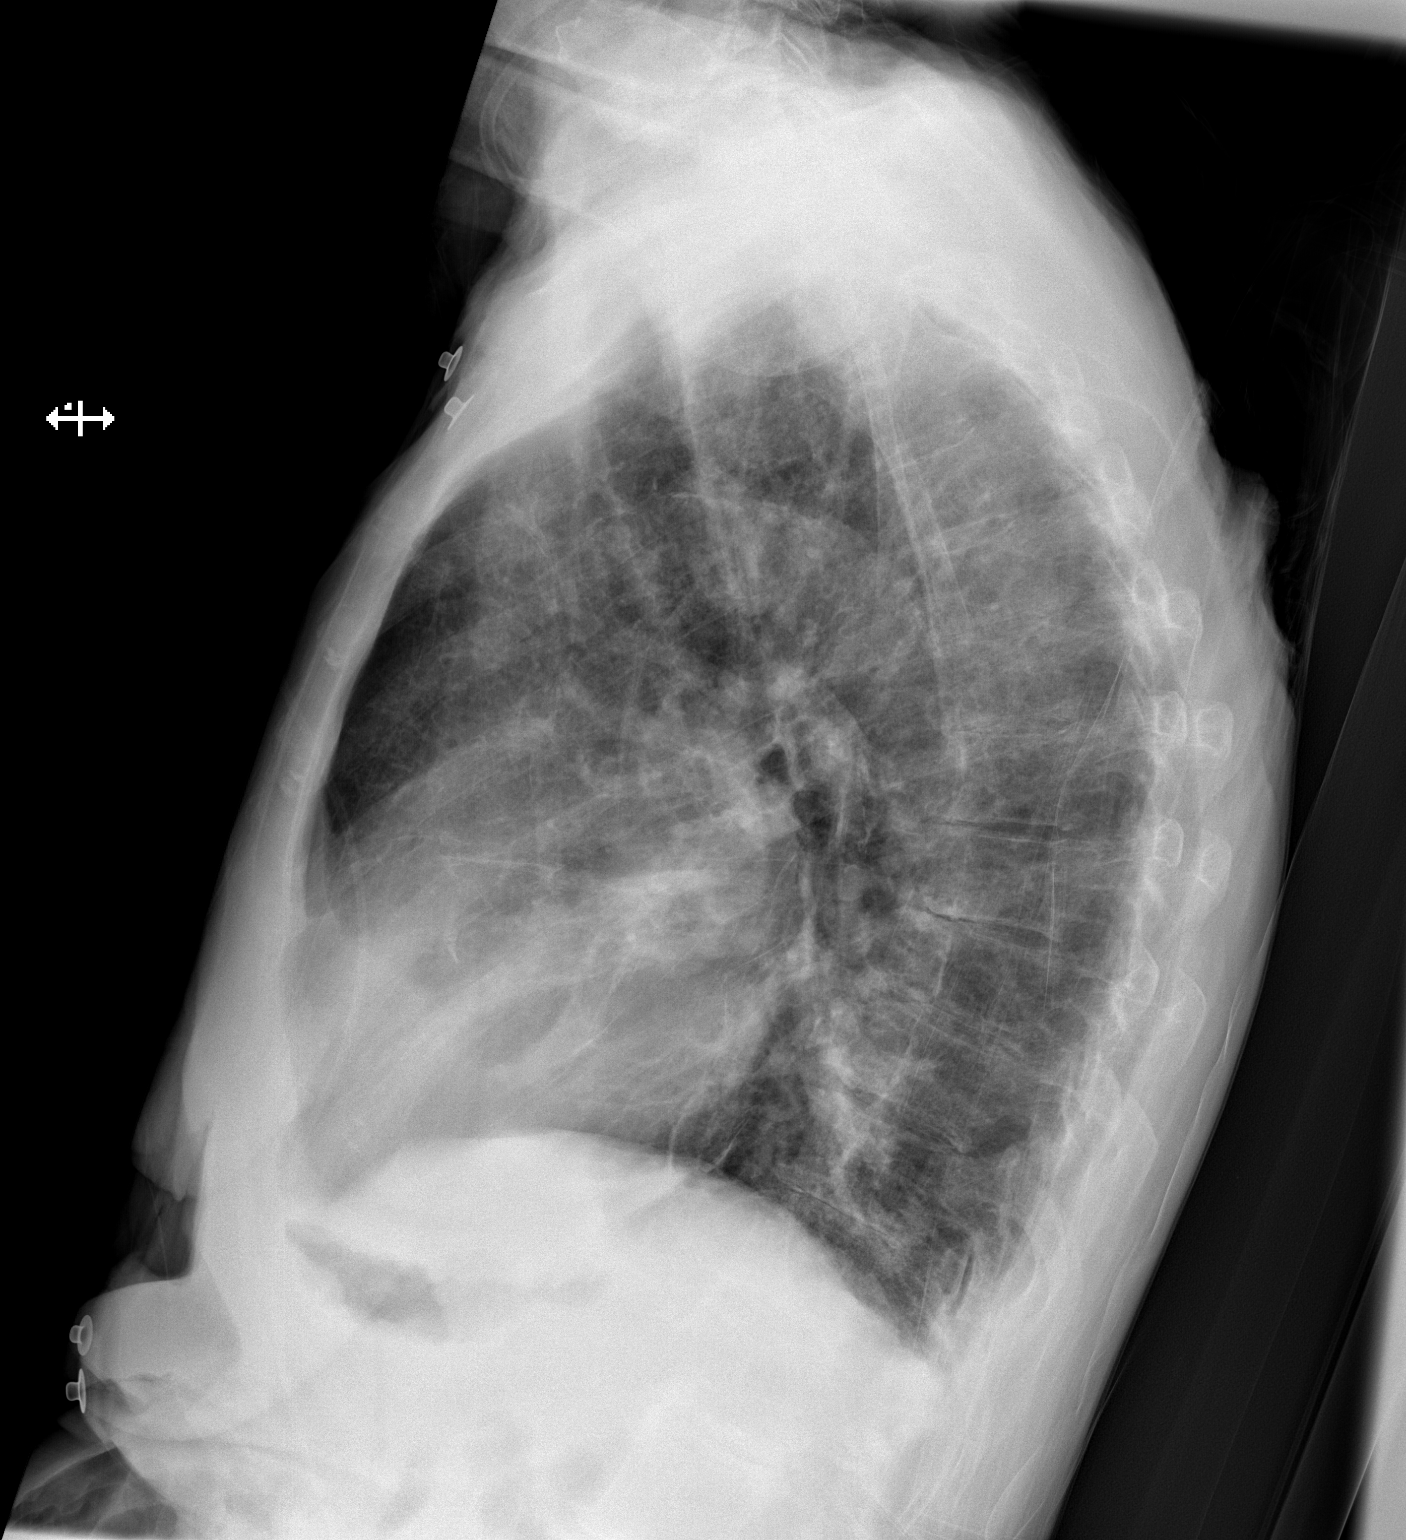

[2 of 2 positions shown; findings below may reference images not displayed]

PROCEDURE:     DXR - DXR CHEST PA (OR AP) AND LATERAL  - December 04, 2011  [DATE]

RESULT:     Comparison is made to a prior study dated 10/03/2011.

Diffuse ill-defined opacity is identified within the right and left
hemithoraces. Focal areas of increased density project within the region of
the right middle lobe. The cardiac silhouette is enlarged indicative of
cardiomegaly. There is thickening and indistinctness of the pulmonary
vascular and interstitial markings as well as a  component of peribronchial
cuffing. The visualized bony skeleton demonstrates S-shaped scoliotic
curvature of the thoracolumbar spine.
IMPRESSION: 1.     Interstitial infiltrate a component of which may represent pulmonary
edema versus underlying component of pulmonary fibrosis.
2.     Focal atelectasis versus infiltrate versus possibly areas of chronic
scarring in the region of the right middle lobe.
3.     Cardiomegaly.

## 2014-09-03 NOTE — Discharge Summary (Signed)
PATIENT NAME:  Michele Hayes, Michele Hayes MR#:  [id number] DATE OF BIRTH:  10/09/1925  DATE OF ADMISSION:  05/14/2011 DATE OF DISCHARGE:  05/16/2011  DISCHARGE DIAGNOSES:  1. Acute chronic obstructive pulmonary disease exacerbation. 2. History of hypertension. 3. Ongoing tobacco abuse.  4. Hyperlipidemia.  5. Depression. 6. History of chronic diastolic congestive heart failure. 7. Sinus tachycardia.  8. Hyponatremia. 9. Hyperglycemia.   DISPOSITION: The patient is being discharged home.   FOLLOW-UP: Follow-up with primary care physician, Dr. Ronna PolioJennifer Walker, in 1 to 2 weeks after discharge.   DIET: Low sodium.   ACTIVITY: As tolerated.   DISCHARGE MEDICATIONS:  1. Oxygen 2 liters by nasal cannula.  2. Lopressor 25 mg b.i.d.  3. Levaquin 500 mg daily for 5 days. 4. Symbicort 2 puffs b.i.d.  5. Xopenex/ipratropium nebs q.6 hours p.Hayes.n. shortness of breath.  6. Prednisone taper as prescribed. 7. Levothyroxine 50 mcg daily.  8. Paroxetine 20 mg daily. 9. Aspirin 81 mg daily. 10. Colace 100 mg daily.  11. Ferrous sulfate 325 mg daily.  12. Vitamin D3 1000 international units daily. 13. Prolia 60 mg subcutaneously yearly. 14. Cardizem 180 mg daily.  15. Mucinex 600 mg daily p.Hayes.n.  16. Oyster Shell Calcium b.i.d.  17. Spiriva 18 mcg inhaled daily.   CONSULTATION: Cardiology consultation with Dr. Mariah MillingGollan   RESULTS: 1. 2-D echo EF more than 55%, elevated right ventricular systolic pressure of 30 to 40 mm. Right ventricular systolic function is normal. Left atrium is mildly dilated.  2. V/Q scan was low probability for PE.  3. Chest x-ray showed interstitial pulmonary edema, chronic atelectasis right middle lobe.   MICROBIOLOGY: Blood cultures no growth in 36 hours. BNP 1860 on admission. Sodium 133. Glucose 140. Normal electrolytes. Normal renal function. Normal LFTs. Cardiac enzymes normal. TSH normal.   HOSPITAL COURSE: The patient is an 81 year old female with past medical history of  chronic respiratory failure due to chronic obstructive pulmonary disease on home O2, hypertension, and hyperlipidemia who presented with shortness of breath. The patient was found to have acute on chronic respiratory failure due to COPD exacerbation and started on oxygen therapy, nebulizers, steroids, antibiotics treatment, Spiriva and Symbicort. Blood cultures were done which showed no growth so far. Chest x-ray did not show any acute infiltrate. The patient has chronic diastolic CHF and EF was more than 55% with mild pulmonary hypertension. She had sinus tachycardia which was felt to be due to COPD. Her V/Q scan was normal. Her TSH was normal. Cardiology consultation with Dr. Mariah MillingGollan was obtained and the patient was started on Cardizem and low dose beta-blocker. She had mild hyponatremia on admission which has currently resolved. She was also hyperglycemic which was felt to be reactive/steroid-induced. Overall there was a significant improvement in the patient's condition and she is being discharged home in stable condition.   TIME SPENT: 45 minutes.  ____________________________ Darrick MeigsSangeeta Johan Creveling, MD sp:drc D: 05/16/2011 16:52:10 ET T: 05/19/2011 10:42:38 ET JOB#: [id number] cc: Darrick MeigsSangeeta Lanika Colgate, MD, <Dictator>, Ginette PitmanJennifer A. Dan HumphreysWalker, MD Darrick MeigsSANGEETA Ana Liaw MD ELECTRONICALLY SIGNED 05/19/2011 12:18

## 2014-09-03 NOTE — Consult Note (Signed)
General Aspect 81 year old female who presents with COPD, 40+ years of smoking, prvious epsiodes of PNA, presenting with cough, nasal and chest congestion for 5 days. Cardioliogy was consulted for CHF concern.  She says that sx are similar to previous epsiodes of PNA. She is afraid her cough and congestion will get worse. Increase in her shortness of breath.  She has poor appetite and  weakness. She has had history of falls.  The patient reports dry cough. She denies any fevers or chills. She has been taking her inhalers.  She is on home oxygen continuously.   She denies any cardiac hx in the past. She reports having a stress test three weeks ago that apparently showed no ischemia.   PAST MEDICAL HISTORY:  1. Hypertension.  2. Chronic obstructive pulmonary disease, continues to smoke.  3. Partial hysterectomy. 4. Left foot surgery.  5. Tubal ligation.  6. Benign breast tumor. 7. Appendectomy.  8. Cholecystectomy.    Present Illness . CURRENT MEDICATIONS:  1. Aspirin 81 mg p.o. daily.  2. Cardizem 180 mg p.o. daily. 3. Docusate sodium 100 mg p.o. p.r.n.  4. Ferrous sulfate 325 mg p.o. daily. 5. Levaquin 750 mg p.o. q.24 hours. 6. Levothyroxine 50 mcg p.o. daily.  7. Lisinopril 5 mg p.o. daily.  8. Mucinex 600 mg p.o. b.i.d.   9. Oyster Shell Calcium 1 p.o. daily.  10. Paroxetine 20 mg p.o. daily.  11. Performance 20 mcg or 2 mL inhaler two inhalers b.i.d.  12. Prednisone tapering dose.  13. Prolia 60 mg/mL injection yearly. 14. Spiriva 18 mcg 1 puff daily. 15. Ventolin 90 mcg inhaler 2 puffs b.i.d.   SOCIAL HISTORY: The patient is a smoker x 40+ years. Denies alcohol abuse or drug abuse.   FAMILY HISTORY: Father died at 40 years old, old age. Mother died at 41 years old. She had diabetes.   Physical Exam:   GEN WD, WN, NAD, thin    HEENT red conjunctivae    NECK supple     RESP wheezing  rhonchi  crackles     CARD Regular rate and rhythm  Murmur     Murmur Systolic      ABD denies tenderness  soft     LYMPH negative neck    EXTR negative edema    SKIN normal to palpation    NEURO motor/sensory function intact    PSYCH alert, A+O to time, place, person, good insight   Review of Systems:   Subjective/Chief Complaint SOB, cough and congestion    General: Weakness     Skin: No Complaints     ENT: No Complaints     Eyes: No Complaints     Neck: No Complaints     Respiratory: Frequent cough  Short of breath  Wheezing  Sputum     Cardiovascular: No Complaints     Gastrointestinal: No Complaints     Genitourinary: No Complaints     Vascular: No Complaints     Musculoskeletal: No Complaints     Neurologic: No Complaints     Hematologic: No Complaints     Endocrine: No Complaints     Psychiatric: No Complaints     Review of Systems: All other systems were reviewed and found to be negative     Medications/Allergies Reviewed Medications/Allergies reviewed           Admit Diagnosis:   CHF: 14-May-2011, Active, CHF      Admit Reason:   Congestive heart failure, unspecified: (  428.0) 14-May-2011, Active, ICD9, Congestive heart failure, unspecified, Auto-generated by [facility] Based on Admission Order      DME/HH/HomeO2:   Ace Gins: 15-Mar-2008, Active  Home Medications:   levothyroxine 50 mcg (0.05 mg) oral tablet: 1 tab(s) orally once a day , Active  paroxetine 20 mg oral tablet: 1 tab(s) orally once a day, Active  aspirin 81 mg oral tablet: 1 tab(s) orally once a day, Active  docusate sodium 100 mg oral capsule: 1 cap(s) orally as needed., Active  ferrous sulfate 325 mg (65 mg elemental iron) oral tablet: 1 tab(s) orally once a day, Active  Perforomist 20 mcg/2 mL inhalation solution: 2 milliliter(s) inhaled 1 to 2 times a day, Active  Ventolin HFA 90 mcg/inh inhalation aerosol: 2 puff(s) inhaled 2 times a day, Active  Vitamin D3 1000 intl units oral tablet: 1 tab(s) orally once a day, Active  Prolia 60 mg/mL subcutaneous  solution: Inject 60 mg (1 milliliters) subcutaneous yearly., Active  lisinopril 5 mg oral tablet: 1 tab(s) orally once a day, Active  diltiazem 180 mg/24 hours oral capsule, extended release: 1 cap(s) orally once a day, Active  Mucinex 600 mg oral tablet, extended release: 1 tab(s) orally as needed., Active  Oyster Shell Calcium 500 (1250 mg calcium carbonate) oral tablet: 1 tab(s) orally 2 times a day with food, Active  Spiriva 18 mcg inhalation capsule: Inhale 1 cap(s) via handihaler once a day, Active    Cardiac:  01-Jan-13 19:27    CK, Total 33   CPK-MB, Serum 1.1  Routine Hem:  01-Jan-13 19:27    WBC (CBC) 11.8   RBC (CBC) 4.34   Hemoglobin (CBC) 12.6   Hematocrit (CBC) 39.6   Platelet Count (CBC) 282   MCV 91   MCH 29.1   MCHC 32.0   RDW 15.6  Routine Chem:  01-Jan-13 19:27    Glucose, Serum 140   BUN 20   Creatinine (comp) 0.70   Sodium, Serum 130   Potassium, Serum 3.9   Chloride, Serum 90   CO2, Serum 32   Calcium (Total), Serum 8.9  Hepatic:  01-Jan-13 19:27    Bilirubin, Total 0.3   Alkaline Phosphatase 92   SGPT (ALT) 18   SGOT (AST) 22   Total Protein, Serum 7.5   Albumin, Serum 3.2  Routine Chem:  01-Jan-13 19:27    Osmolality (calc) 266   eGFR (African American) >60   eGFR (Non-African American) >60   Anion Gap 8  Cardiac:  01-Jan-13 19:27    Troponin I < 0.02  Routine Chem:  01-Jan-13 19:27    B-Type Natriuretic Peptide ([facility]) 1860  Routine Coag:  01-Jan-13 19:27    D-Dimer, Quantitative 0.54  Cardiology:  01-Jan-13 23:00    Ventricular Rate 112   Atrial Rate 112   P-R Interval 180   QRS Duration 128   QT 382   QTc 521   R Axis -84   T Axis 26  Cardiac:  02-Jan-13 12:33    CPK-MB, Serum 2.1   Troponin I < 0.02   EKG:   Interpretation EKG shows sinus tachycardia with frequent APCs, RBBB, LAFB   Radiology Results:  XRay:    01-Jan-13 20:44, Chest PA and Lateral   Chest PA and Lateral    REASON FOR EXAM:    Shortness of  Breath  COMMENTS:   May transport without cardiac monitor    PROCEDURE: DXR - DXR CHEST PA (OR AP) AND LATERAL  - May 13 2011  8:44PM     RESULT: Comparison: 04/08/2011    Findings:  Cardiomegaly and the mediastinum are similar to prior. Atelectasis in the   region of the right middle lobe is similar to prior. Small nodular   density overlying the left upper lung is not seen on the prior study, and   felt to be artifactual secondary to the overlying second costochondral   junction. There are bilateral interstitial pulmonary opacities.    IMPRESSION:   1. Interstitial pulmonary edema. Atypical infection is a differential   consideration.   2. Unchanged findings of atelectasis in the right middle lobe.   Bronchoscopy is suggested if not already performed.  3. Small nodular density overlying left upper lung is not seen on recent   prior, and felt to be artifactual. However, followup radiographs are   suggested to ensure resolution/stability.          VerifiedBy: Gregor Hams, M.D., MD  Cardiology:    02-Jan-13 12:10, Echo Doppler   Echo Doppler    Interpretation Summary    Significant ectopy noted. Left ventricular systolic function is   normal. Ejection Fraction = >55%. The transmitral spectral Doppler   flow pattern is suggestive of impaired LV relaxation. Septal motion   is consistent with conduction abnormality. The right ventricular   systolic function is normal. The left atrium is mildly dilated.   There is mild tricuspid regurgitation. Right ventricular systolic   pressure is elevated at 30-47mHg.    Procedure:    The study was completed  in the emergency room.    A two-dimensional transthoracic echocardiogram with color flow and   Doppler was performed.    Left Ventricle    Ejection Fraction = >55%.    The transmitral spectral Doppler flow pattern is suggestive of   impairedLV relaxation.    Septal motion is consistent with conduction abnormality.    The left  ventricle is normal in size.    There is normal left ventricular wall thickness.    Left ventricular systolic function is normal.    Right Ventricle    The right ventricle is normal size.    The right ventricular systolic function is normal.    Atria    The left atrium is mildly dilated.    Right atrial size is normal.    There is no Doppler evidence for an atrial septal defect.    Mitral Valve    The mitral valve leaflets appear normal. There is no evidence of   stenosis, fluttering, or prolapse.    There is trace mitral regurgitation.    There is no mitral valve stenosis.    Tricuspid Valve    The tricuspid valve is normal.    There is mild tricuspid regurgitation.    Right ventricular systolic pressure is elevated at 30-460mg.    Aortic Valve    The aortic valve opens well.    No aortic regurgitation is present.    No hemodynamically significant valvular aortic stenosis.    Pulmonic Valve    The pulmonic valve leaflets are thin and pliable; valve motion is   normal.    There is no pulmonic valvular regurgitation.    Great Vessels    The aortic root is normal size.    The pulmonary is not well visualized.    Pericardium/Pleural    There is no pleural effusion.    No pericardial effusion.    MMode 2D Measurements and Calculations    RVDd: 2.9 cm  IVSd: 1.1 cm    LVIDd: 4.8 cm    LVIDs: 3.6 cm    LVPWd: 1.1 cm    FS: 25 %    EF(Teich): 50 %    Ao root diam: 2.4 cm    LA dimension: 3.9 cm    LVOT diam: 2.0 cm    Doppler Measurements and Calculations    MV E point: 73 cm/sec    MV A point: 113 cm/sec    MV E/A: 0.64     MV dec time: 0.17 sec    Ao V2 max: 94 cm/sec    Ao max PG: 4.0 mmHg    AVA(V,D): 2.4 cm2    LV max PG: 2.0 mmHg    LV V1 max: 72 cm/sec    PA V2 max: 69 cm/sec    PA max PG: 2.0 mmHg    TR Max vel: 224 cm/sec    TR Max PG: 20 mmHg    RVSP: 25 mmHg    RAP systole: 5.0 mmHg    Reading Physician: Ida Rogue   Sonographer:  Sherrie Sport  Interpreting Physician:  Ida Rogue,  electronically signed on   05-14-2011 13:53:24  Requesting Physician: Ida Rogue  Nuclear Med:    02-Jan-13 08:52, Lung VQ Scan - Nuc Med   Lung VQ Scan - Nuc Med    REASON FOR EXAM:    + d dimer  tachycardia  low  sat  COMMENTS:       PROCEDURE: NM  - NM VQ LUNG SCAN  - May 14 2011  8:52AM     RESULT: Following aerosol administration of 41.2 to 4 mCi technetium 94m  DTPA, ventilation lung scan was performed in 8 views. After the   ventilation scan and following intravenous administration of 4.081 mCi   technetium 99 M macroaggregated albumin, perfusion lung scan was   performed in 8 views. There is a diffuse heterogeneous distribution of   tracer activity in both lungs on the ventilation and perfusion scans. The   findings are more prominent on the ventilation scan consistent with   ventilatory disease. No mismatched perfusion defects are seen to suggest   pulmonary embolus.    IMPRESSION:   1. This is a limited examination due to a diffusely heterogeneous   distribution of tracer activity compatible with COPD and possible   superimposed CHF.  2. No mismatched perfusion defects suspicious for pulmonary embolus are   identified and the exam is therefore considered low probability for   pulmonary embolus.          Verified By: JDionne AnoWALL, M.D., MD    Sulfa: Unknown  Shellfish: Unknown  Vital Signs/Nurse's Notes:  **Vital Signs.:   02-Jan-13 17:18   Vital Signs Type Routine   Temperature Temperature (F) 98.1   Celsius 36.7   Temperature Source oral   Pulse Pulse 107   Pulse source per Dinamap   Respirations Respirations 20   Systolic BP Systolic BP 1976  Diastolic BP (mmHg) Diastolic BP (mmHg) 72   Mean BP 91   BP Source Dinamap   Pulse Ox % Pulse Ox % 96   Pulse Ox Activity Level  At rest   Oxygen Delivery 2L     Impression 81year old female who presents with COPD, 40+ years of smoking, previous  episodes of PNA, presenting with cough, nasal and chest congestion for 5 days.  previous stress test three weeks ago by report.  Echo shows normal  EF, mild pulmonary HTN.  A/P: 1) SOB, cough Sx are consistent with COPD exacerbation, possible early PNA. Unable to exclude mild diastolic CHF She does have mild pulmonary HTN on echo --Would agree with steroids, ABX, --Will D/c IVF (125 /hr) and would continue lasix po BID for now. Monitor creatinine and wean lasix for climb in creatinine.  2) COPD: long smoking hx who continues to smoke. Smoking cessation recommended. She is on inhalers. Frequent PNA.  She has used levoquin and prednisone in the past.  3) Sinus tach with APCS Suspect secondary to poor lung function. Rate >100 She is high risk for atrial fib. Diltiazem should help rate. Could add low dose bystolic if needed.  4) Pulmonary HTN: Likely secondary to poor lung function/COPD. She may benefit from low dose lasix QOD as an outpt.   Electronic Signatures: Ida Rogue (MD)  (Signed 02-Jan-13 19:36)  Authored: General Aspect/Present Illness, History and Physical Exam, Review of System, Health Issues, Home Medications, Labs, EKG , Radiology, Allergies, Vital Signs/Nurse's Notes, Impression/Plan   Last Updated: 02-Jan-13 19:36 by Ida Rogue (MD)

## 2014-09-03 NOTE — Consult Note (Signed)
General Aspect 81 year old female with past medical history of COPD on oxygen at night, 40 year smoking history , hypertension, and history of congestive heart failure, pneumonia in January 2013 as well as in 2012, presenting with several weeks of increasing cough, shortness of breath. Cardiology was consulted for abnormal EKG.  She reports that she has had significant shortness of breath, sputum production, low-grade fever and fatigue. She denies any lightheadedness, chest pain.  She is unable to get in a comfortable position.   In the ER she was found to be hypoxic and placed on oxygen, found to have a right lobe pneumonia on chest x-ray and CT scan also with bilateral lower lobe infiltrate. She has been started on antibiotics and BiPAP.   Stress test 6 months ago that showed no significant ischemia. Echocardiogram January 2013 showing normal LV systolic function with mild pulmonary hypertension.    Present Illness .  PAST MEDICAL HISTORY:  1. Hypertension.  2. Chronic obstructive pulmonary disease.  3. Hypothyroidism.  4. History of congestive heart failure.  5. Anemia. 6. Vitamin D deficiency.   PAST SURGICAL HISTORY:  1. Partial hysterectomy. 2. Left foot surgery. 3. Tubal ligation.  4. Benign breast tumor.  5. Appendectomy.  6. Cholecystectomy.   ALLERGIES: Sulfa.  MEDICATIONS:   1. Albuterol nebulizer q.6 hours p.r.n.  2. Aspirin 81 mg daily.  3. Diltiazem 180 mg daily.  4. Ferrous sulfate 325 mg daily.  5. Ipratropium nebulizer daily.  6. K-Dur 10 mEq as needed for swelling.  7. Lasix 20 mg as needed for swelling.  8. Levothyroxine 50 mcg daily.  9. Lisinopril 5 mg daily.  10. Metoprolol 25 mg daily.  11. Paxil 20 mg daily.  12. Spiriva 1 inhalation daily.  13. Symbicort 80/4.5 2 puffs twice a day.  14. Vitamin D3 1000 IU daily.    SOCIAL HISTORY: Current smoker of five cigarettes per day. Worked as a Pharmacist, hospital in the past. No alcohol or drug use. Her daughter  lives with her.   FAMILY HISTORY: Father died at 49 of heart disease. Mother died at 39 of heart disease.   Physical Exam:   GEN well developed, well nourished, no acute distress, thin    HEENT red conjunctivae    NECK supple    CARD Regular rate and rhythm  Murmur    Murmur Systolic    Systolic Murmur Out flow    ABD denies tenderness  soft    LYMPH negative neck    NEURO cranial nerves intact, motor/sensory function intact    PSYCH alert, A+O to time, place, person   Review of Systems:   Subjective/Chief Complaint Shortness of breath, cough, fatigue    General: Fatigue  Weakness    Skin: No Complaints    ENT: No Complaints    Eyes: No Complaints    Neck: No Complaints    Respiratory: Frequent cough  Short of breath  Wheezing    Cardiovascular: Dyspnea    Gastrointestinal: No Complaints    Genitourinary: No Complaints    Vascular: No Complaints    Musculoskeletal: No Complaints    Neurologic: No Complaints    Hematologic: No Complaints    Endocrine: No Complaints    Psychiatric: No Complaints    Review of Systems: All other systems were reviewed and found to be negative    Medications/Allergies Reviewed Medications/Allergies reviewed     HTN:    copd:    Hysterectomy - Partial:    left foot  surgery:    tubal ligation: 1967   benign breast tumor: 1970   appendectomy: 12 to 13 years ago   Cholecystectomy: 2003       Admit Diagnosis:   ACUTE RESPIRATORY FAILURE: 01-Oct-2011, Active, ACUTE RESPIRATORY FAILURE      Admit Reason:   Acute respiratory failure: (518.81) Active, ICD9, Acute respiratory failure      DME/HH/HomeO2:   Ace Gins: 15-Mar-2008, Active  Home Medications: Medication Instructions Status  levothyroxine 50 mcg (0.05 mg) oral tablet 1 tab(s) orally once a day  Active  aspirin 81 mg oral tablet 1 tab(s) orally once a day Active  Vitamin D3 1000 intl units oral tablet 1 tab(s) orally once a day Active  diltiazem  180 mg/24 hours oral capsule, extended release 1 cap(s) orally once a day Active  Spiriva 18 mcg inhalation capsule Inhale 1 cap(s) via handihaler once a day Active  Symbicort 80 mcg-4.5 mcg/inh inhalation aerosol 2 puff(s) inhaled 2 times a day Active  ferrous sulfate 325 mg (65 mg elemental iron) oral tablet 1 tab(s) orally once a day with breakfast Active  metoprolol tartrate 25 mg oral tablet 1 tab(s) orally once a day Active  paroxetine 20 mg oral tablet 1 tab(s) orally once a day (in the morning) Active  ipratropium 500 microgram(s) inhaled every 6 hours, As Needed Active  Lasix 20 mg oral tablet 1 tab(s) orally once a day, As Needed for swelling Active  lisinopril 5 mg oral tablet 1 tab(s) orally once a day Active  albuterol 2.5 mg/3 mL (0.083%) inhalation solution 3 milliliter(s) inhaled every 6 hours, As Needed- for Wheezing  Active  K-Dur 10 1 tab(s) orally once a day, As Needed. Take with lasix Active     Cardiac:  22-May-13 07:40    CK, Total 29   CPK-MB, Serum 1.8  Routine Hem:  22-May-13 07:40    WBC (CBC) 20.1   RBC (CBC) 4.56   Hemoglobin (CBC) 13.0   Hematocrit (CBC) 42.0   Platelet Count (CBC) 297   MCV 92   MCH 28.5   MCHC 31.0   RDW 16.1  Routine Chem:  22-May-13 07:40    Glucose, Serum 162   BUN 19   Creatinine (comp) 0.51   Sodium, Serum 134   Potassium, Serum 4.0   Chloride, Serum 93   CO2, Serum 39   Calcium (Total), Serum 8.6  Hepatic:  22-May-13 07:40    Bilirubin, Total 0.5   Alkaline Phosphatase 115   SGPT (ALT) 29   SGOT (AST) 34   Total Protein, Serum 7.7   Albumin, Serum 3.0  Routine Chem:  22-May-13 07:40    Osmolality (calc) 274   eGFR (African American) >60   eGFR (Non-African American) >60   Anion Gap 2  Cardiac:  22-May-13 07:40    Troponin I < 0.02    16:15    Troponin I < 0.02  Lab:  22-May-13 17:00    O2 Device BIPAP   EKG:   Interpretation EKG shows normal sinus rhythm with rate 98 beats per minute, PVCs, right  bundle branch block, left anterior fascicular block   Radiology Results: XRay:    22-May-13 07:52, Chest Portable Single View   Chest Portable Single View    REASON FOR EXAM:    sob  COMMENTS:       PROCEDURE: DXR - DXR PORTABLE CHEST SINGLE VIEW  - Oct 01 2011  7:52AM     RESULT: Comparison is made to the  previous exam of  January, 1, 2013.    The lung markings are prominent. The cardiac silhouette is enlarged.   There is no focal consolidation or definite mass. Pulmonary vascular   congestion with peribronchial cuffing is present. Some left lung base   atelectasis or fibrosis is present. Minimal left lung base pneumonia is   felt to be less likely.    IMPRESSION:      1. Cardiomegaly with pulmonary edema.   2. There may be an underlying component of chronic atelectasis or   fibrosis. Followup PA and lateral views of the chest would be helpful.    Thank you for the opportunity to contribute to the care of your patient.     Dictation Site: 2          Verified By: Sundra Aland, M.D., MD  CT:    22-May-13 10:15, CT Chest for Pulm Embolism With Contrast   CT Chest for Pulm Embolism With Contrast    REASON FOR EXAM:    also recuurent pneumonia same spot  COMMENTS:       PROCEDURE: CT  - CT CHEST (FOR PE) W  - Oct 01 2011 10:15AM     RESULT:     Technique: Helical 3 mm sections were obtained from the thoracic inlet   through the lung bases statuspost intravenous administration 75 mL of   Isovue-370.    Findings:  Evaluation of the mediastinum and hilar regions and structures   demonstrates multichambered cardiac enlargement. A subcarinal lymph node   is identified measuring 1.3 cm in narrowest dimensions. A right hilar   lymph node is identified. Small paratracheal lymph nodes are also     appreciated.    There is no evidence of filling defects within the main, lobar, or   segmental pulmonary arteries. Evaluation of the lung parenchyma   demonstrates decreased  size and conspicuity of the previously described   right middle lobe opacity. Trace left pleural effusion is identified as   well as atelectasis versus infiltrate within the right lung base and a   trace right pleural effusion.There is diffuse thickening of the   interstitial markings and mild diffuse ground-glass opacities throughout   both lungs.    The visualized upper abdominal viscera demonstrate a stable left adrenal   mass. Otherwise grossly unremarkable. There is no evidence of a thoracic   aortic aneurysm or dissection.    IMPRESSION:      1. There has been marked decrease in the amount of consolidative lung in   the right middle lobe. Residual ill-defined density is appreciated within   the periphery of the right middle lobe. This area demonstrates dimensions   of approximately 2.17 x 2.02 cm in orthogonal dimensions. This may   represent residual atelectatic lung, infiltrate though more ominous   etiologies cannot be excluded and surveillance evaluation is recommended.  2. Enlarged mediastinal lymph nodes as described above.  3. No evidence of pulmonary arterial embolic disease.   4. Multichambered cardiomegaly. 5. Interstitial infiltrate. Differential   considerations are edematous versus nonedematous infiltrate with possibly   an underlying component of pulmonary fibrotic changes.   6. Small trace effusions within the lung bases right greater the left as   well as likely atelectasis, mild.   Thank you for the opportunity to contribute to the care of your patient.           Verified By: Mikki Santee, M.D., MD    Sulfa: Unknown  Shellfish: Unknown  Vital Signs/Nurse's Notes: **Vital Signs.:   22-May-13 11:00   Pulse Ox % Pulse Ox % 90    14:47   Temperature Temperature (F) 95.8   Celsius 35.4   Temperature Source tympanic   Pulse Pulse 97   Respirations Respirations 24   Systolic BP Systolic BP 017   Diastolic BP (mmHg) Diastolic BP (mmHg) 60   Mean BP  85   Pulse Ox % Pulse Ox % 90   Pulse Ox Activity Level  At rest     Impression 81 year old female with past medical history of COPD on oxygen at night, 40 year smoking history , hypertension, and history of congestive heart failure, pneumonia in January 2013 as well as in 2012, presenting with several weeks of increasing cough, shortness of breath. Cardiology was consulted for abnormal EKG.  EKG shows normal sinus rhythm with ectopy/PVCs, bifascicular block (right bundle branch block with left anterior fascicular block).  1) abnormal EKG Bifascicular block noted Review of previous EKGs show EKG is relatively unchanged and bifascicular block was noted in 2010. --Would continue her current outpatient medication regimen for now We can monitor her rate and rhythm on telemetry  2) shortness of breath ABG showing hypoxia, likely from underlying severe COPD and pneumonia She is currently on BiPAP with antibiotics  3) pulmonary hypertension Mild pulmonary hypertension seen on previous EKG in early 2013 On her last hospital admission, we had discussed possibly starting Lasix every other day if needed for shortness of breath symptoms. -She currently takes Lasix every day when necessary for edema  4) COPD Long smoking history, severe emphysema She does not have outpatient pulmonary followup She may benefit from seeing Dr. Waunita Schooner as an outpatient (who is in the same office with Dr. Ronette Deter)   Electronic Signatures: Ida Rogue (MD)  (Signed [phone] 17:28)  Authored: General Aspect/Present Illness, History and Physical Exam, Review of System, Past Medical History, Health Issues, Home Medications, Labs, EKG , Radiology, Allergies, Vital Signs/Nurse's Notes, Impression/Plan   Last Updated: 22-May-13 17:28 by Ida Rogue (MD)

## 2014-09-03 NOTE — Discharge Summary (Signed)
PATIENT NAME:  Michele Hayes, Michele Hayes MR#:  [id number] DATE OF BIRTH:  1926-02-16  DATE OF ADMISSION:  10/01/2011 DATE OF DISCHARGE:  10/04/2011  ADMITTING DIAGNOSIS: Shortness of breath.   DISCHARGE DIAGNOSES:  1. Acute on chronic respiratory failure.  2. Acute on chronic obstructive pulmonary disease exacerbation.  3. Atypical pneumonia.  4. Hypertension.  5. Bifascicular heart block, seen by cardiology. The patient's metoprolol has been discontinued, per cardiology.  6. Hypothyroidism.  7. History of congestive heart failure in the past.  8. Anemia.  9. Vitamin D deficiency.  10. Status post partial hysterectomy.  11. Status post left foot surgery.  12. Status post tubal ligation.  13. Benign breast tumor resection.  14. Status post appendectomy.  15. Status post cholecystectomy.   CONSULTANTS:  1. Erin FullingKurian Kasa, MD - Pulmonary. 2. Julien Nordmannimothy Gollan, MD - Cardiology.  PERTINENT LABS/EVALUATIONS: Glucose 162, BUN 19, creatinine 0.51, sodium 134, potassium 4.0, chloride 93, and CO2 39. LFTs were normal. WBC count 20.1, hemoglobin 13, hematocrit 42, and platelet count 297.   Chest x-ray showed cardiomegaly with pulmonary edema.   CT of the chest showed consolidated lung in the right middle lobe, ill-defined density right middle lobe, and large mediastinal lymph node. No pulmonary embolism.   WBC on 05/24 was 15.4. TSH 1.31.   ABG: pH 7.31, pCO2 83, and PaO2 45.   HOSPITAL COURSE: Please see the history and physical done by the admitting physician, Dr. Alford Highlandichard Wieting. The is an 81 year old female with past medical history of likely advanced chronic obstructive pulmonary disease who uses oxygen, especially at nighttime. She has hypertension and previous history of CHF who presented to the ED with shortness of breath going on for the past 3 to 4 days. The patient was also coughing up yellowish sputum. The patient was seen in the ED and had a chest x-ray which suggested possible congestive heart  failure and/or atypical pneumonia. She had a CT scan of the chest which did confirm pneumonia. The patient was started on IV antibiotics. Also, she was noted to have hypercarbic failure and had to be placed on BiPAP. The patient was continued on antibiotics and IV steroids. She had a CT scan of the chest. She was seen by Dr. Erin FullingKurian Kasa who recommended current antibiotics. She also was noted to have an EKG that showed a bifascicular block. She was seen by Dr. Mariah MillingGollan and Dr. Lorine BearsMuhammad Arida who recommended stopping her metoprolol. The patient reports that she is doing much better and wants to go home today. At this time she appears to be stable for discharge.   DISCHARGE MEDICATIONS:  1. Levothyroxine 50 mcg 1 tab p.o. daily.  2. Aspirin 81 mg one tab p.o. daily.  3. Vitamin D3 1000 international units daily.  4. Diltiazem 180 mg 1 tab p.o. daily.  5. Spiriva 18 mcg inhalation one cap daily.  6. Symbicort 2 puffs twice a day. 7. Iron sulfate 325 mg 1 tab p.o. daily.  8. Paroxetine 20 mg daily.  9. Ipratropium inhalation every 6 hours p.Hayes.n.  10. Lasix 20 mg 1 tab p.o. daily for swelling.  11. Lisinopril 5 mg daily.  12. Albuterol/Atrovent nebulizers every six hours as needed for wheezing. 13. K-Dur 10 milliequivalents as needed, take with Lasix. 14. Prednisone taper 60 mg p.o. x3 days, 40 mg p.o. x3 days, 20 mg p.o. x3 days, 10 mg p.o. x3 days. 15. Levaquin 500 p.o. daily x5 days.   NOTE: The patient is told not to take  metoprolol.   HOME OXYGEN: Yes - the patient requests that she does not want a portable tank. She has a concentrator at home which she will use. I recommended she be on oxygen at all times, at least until seen by primary physician.  DIET: Low sodium.   ACTIVITY: As tolerated.   TIMEFRAME FOR FOLLOW UP: Followup in 1 to 2 weeks with Dr. Ronna Polio for primary physician followup.   TIME SPENT: 35 minutes.  ____________________________ Lacie Scotts Allena Katz,  MD shp:slb D: 10/04/2011 10:55:16 ET T: 10/06/2011 11:28:50 ET JOB#: [id number]  cc: Itati Brocksmith H. Allena Katz, MD, <Dictator> Ginette Pitman. Dan Humphreys, MD Charise Carwin MD ELECTRONICALLY SIGNED 10/09/2011 13:36

## 2014-09-03 NOTE — H&P (Signed)
PATIENT NAME:  Michele Hayes, Michele Hayes MR#:  [id number] DATE OF BIRTH:  1925/12/01  DATE OF ADMISSION:  10/01/2011  PRIMARY CARE PHYSICIAN: Ronna Polio, MD   CHIEF COMPLAINT: Shortness of breath going on for the last 3 or 4 days.   HISTORY OF PRESENT ILLNESS: This is an 81 year old female with past medical history of COPD on oxygen at night, hypertension, and history of congestive heart failure. She presents to the ER with shortness of breath going on for the past 3 or 4 days. She is unable to get in a comfortable position. She is coughing up yellowish phlegm. She has had a low-grade fever and very fatigued. In the ER she was found to be hypoxic and placed on oxygen, found to have a right lobe pneumonia and hospitalist services were contacted for evaluation.   PAST MEDICAL HISTORY:  1. Hypertension.  2. Chronic obstructive pulmonary disease.  3. Hypothyroidism.  4. History of congestive heart failure.  5. Anemia. 6. Vitamin D deficiency.   PAST SURGICAL HISTORY:  1. Partial hysterectomy. 2. Left foot surgery. 3. Tubal ligation.  4. Benign breast tumor.  5. Appendectomy.  6. Cholecystectomy.   ALLERGIES: Sulfa.  MEDICATIONS:   1. Albuterol nebulizer q.6 hours p.r.n.  2. Aspirin 81 mg daily.  3. Diltiazem 180 mg daily.  4. Ferrous sulfate 325 mg daily.  5. Ipratropium nebulizer daily.  6. K-Dur 10 mEq as needed for swelling.  7. Lasix 20 mg as needed for swelling.  8. Levothyroxine 50 mcg daily.  9. Lisinopril 5 mg daily.  10. Metoprolol 25 mg daily.  11. Paxil 20 mg daily.  12. Spiriva 1 inhalation daily.  13. Symbicort 80/4.5 2 puffs twice a day.  14. Vitamin D3 1000 IU daily.   SOCIAL HISTORY: Current smoker of five cigarettes per day. Worked as a Runner, broadcasting/film/video in the past. No alcohol or drug use. Her daughter lives with her.   FAMILY HISTORY: Father died at 38 of heart disease. Mother died at 34 of heart disease.   REVIEW OF SYSTEMS: CONSTITUTIONAL: Positive for fever. No chills,  or sweats. Positive for fatigue. Positive for weight loss over several years. EARS, NOSE, MOUTH, AND THROAT: Positive for postnasal drip. No sore throat. No difficulty swallowing. CARDIOVASCULAR: No chest pain. Positive for palpitations. RESPIRATORY: Positive for shortness of breath. Positive for cough, yellow phlegm. No hemoptysis. GASTROINTESTINAL: Positive for constipation. No bright red blood per rectum. No melena. GENITOURINARY: No burning on urination. No hematuria. MUSCULOSKELETAL: No joint pain or muscle pain. INTEGUMENTARY: No rashes or eruptions. NEUROLOGIC: No fainting or blackouts. PSYCHIATRIC: On medication for depression. HEMATOLOGIC/LYMPHATIC: No anemia. No easy bruising or bleeding.   PHYSICAL EXAMINATION:   VITAL SIGNS: On presentation to the ER blood pressure 137/73, respirations 30, pulse 58, temperature 97.2, pulse oximetry 97% on oxygen.   GENERAL: Slight respiratory distress, using accessory muscles to breathe.   EYES: Conjunctivae and lids normal. Pupils equal, round, and reactive to light. Extraocular muscles intact. No nystagmus.   EARS, NOSE, MOUTH, AND THROAT: Tympanic membrane no erythema. Nasal mucosa no erythema. Throat no erythema. No exudate seen. Lips and gums no lesions.   NECK: No JVD. No bruits. No lymphadenopathy. No thyromegaly. No thyroid nodules palpated.   RESPIRATORY: Decreased breath sounds bilaterally. Positive use of accessory muscles to breathe. Positive rhonchi and wheeze throughout entire lung fields. Poor air entry bilaterally.   CARDIOVASCULAR: S1, S2 normal. No gallops, rubs, or murmurs heard. Carotid upstroke 2+ bilaterally. No bruits. Dorsalis pedis pulses 2+  bilaterally. No edema to the lower extremity.   ABDOMEN: Soft, nontender. No organomegaly/splenomegaly. Normoactive bowel sounds. No masses felt.   LYMPHATIC: No lymph nodes in the neck.   MUSCULOSKELETAL: No clubbing, edema, or cyanosis on oxygen.   SKIN: Chronic lower extremity  discoloration.   NEUROLOGIC: Cranial nerves II through XII grossly intact. Deep tendon reflexes 2+ bilateral lower extremities.   PSYCHIATRIC: The patient is oriented to person, place, and time.   LABORATORY AND RADIOLOGICAL DATA:  Troponin negative. Glucose 162, BUN 19, creatinine 0.51, sodium 134, potassium 4.0, chloride 93, CO2 39. Liver function tests normal. White blood cell count 20.1, hemoglobin and hematocrit 13.0 and 42.1, platelet count 297.   Chest x-ray showed cardiomegaly with pulmonary edema, looked like an infiltrate in the right lower lobe.  CT scan of the chest ordered by me shows consolidative lung in the right middle lobe, ill-defined density right middle lobe, enlarged mediastinal lymph nodes, cardiomegaly. No pulmonary embolism.     ASSESSMENT AND PLAN:  1. Acute respiratory failure, oxygen supplementation 3 liters nasal cannula. ABG ordered by me which showed pH 7.31, pCO2 83, pO2 45, bicarb 41.8, oxygen saturation 76.1. The patient was placed on BiPAP in order to oxygenate.  2. Chronic obstructive pulmonary disease exacerbation, recurrent pneumonia in the same spot. CT scan of the chest shows pneumonia. The patient was started on Levaquin and Rocephin, nebulizer treatments, high dose IV Solu-Medrol. Blood cultures were not drawn prior to antibiotics so I will not draw unless she ends up going to the ICU. Sputum culture sent off.  3. Abnormal EKG with bifascicular block, history of congestive heart failure but no signs of congestive heart failure on this admission at this point in time. Will put on telemetry, aspirin, metoprolol. Will ask Cardiology to evaluate meds since the patient is on Cardizem and metoprolol. Continue to monitor on telemetry.  4. Hypertension. Blood pressure currently stable.  5. Hypothyroidism. Continue Synthroid. Check a TSH in the a.m.  6. Tobacco abuse. Smoking cessation counseling done three minutes by me. Nicotine patch applied.   CODE STATUS:  The patient wishes to be a DO NOT RESUSCITATE.   TIME SPENT ON ADMISSION: 55 minutes. Case discussed with Dr. Belia HemanKasa, Pulmonary, to consult and Dr. Mariah MillingGollan.   ____________________________ Herschell Dimesichard J. Renae GlossWieting, MD rjw:drc D: 10/01/2011 16:14:00 ET T: 10/01/2011 16:43:30 ET JOB#: [id number]  cc: Herschell Dimesichard J. Renae GlossWieting, MD, <Dictator> Ginette PitmanJennifer A. Dan HumphreysWalker, MD Salley ScarletICHARD J Karem Farha MD ELECTRONICALLY SIGNED 10/02/2011 13:18

## 2014-09-03 NOTE — H&P (Signed)
PATIENT NAME:  Michele Hayes, Sagan R MR#:  [id number] DATE OF BIRTH:  12/04/1925  DATE OF ADMISSION:  05/14/2011  PRIMARY CARE PHYSICIAN: Ronna PolioJennifer Walker, MD   EMERGENCY ROOM PHYSICIAN: Joseph ArtAlice Finnell, MD   CHIEF COMPLAINT: Shortness of breath.   HISTORY OF PRESENT ILLNESS: The patient is an 81 year old female who presents with chief complaint of shortness of breath. Symptoms have been going on for two weeks. Over the last 1 to 2 days her breathing has become worse. She has had poor appetite. She has had generalized weakness. She has had history of falls. She reports that she has been having some shortness of breath for the last three years. The patient reports dry cough. She denies any fevers or chills. She has a history of chronic obstructive pulmonary disease. She is on home oxygen continuously.   PAST MEDICAL HISTORY:  1. Hypertension.  2. Chronic obstructive pulmonary disease.  3. Partial hysterectomy. 4. Left foot surgery.  5. Tubal ligation.  6. Benign breast tumor. 7. Appendectomy.  8. Cholecystectomy.   CURRENT MEDICATIONS:  1. Aspirin 81 mg p.o. daily.  2. Cardizem 180 mg p.o. daily. 3. Docusate sodium 100 mg p.o. p.r.n.  4. Ferrous sulfate 325 mg p.o. daily. 5. Levaquin 750 mg p.o. q.24 hours. 6. Levothyroxine 50 mcg p.o. daily.  7. Lisinopril 5 mg p.o. daily.  8. Mucinex 600 mg p.o. b.i.d.   9. Oyster Shell Calcium 1 p.o. daily.  10. Paroxetine 20 mg p.o. daily.  11. Performance 20 mcg or 2 mL inhaler two inhalers b.i.d.  12. Prednisone tapering dose.  13. Prolia 60 mg/mL injection yearly. 14. Spiriva 18 mcg 1 puff daily. 15. Ventolin 90 mcg inhaler 2 puffs b.i.d.   SOCIAL HISTORY: The patient is a smoker. Denies alcohol abuse or drug abuse.   FAMILY HISTORY: Father died at 79 years old, old age. Mother died at 79 years old. She had diabetes.   REVIEW OF SYSTEMS:  CONSTITUTIONAL: The patient denies fevers, chills, night sweats. HEENT: The patient denies any hearing loss,  dysphagia, visual problems, sore throat. CARDIOVASCULAR: The patient denies any chest pain, orthopnea, PND. RESPIRATORY: The patient denies any cough, wheezing, or hemoptysis. GI: The patient denies any nausea, vomiting, abdominal pain, hematemesis, hematochezia, or melena. The patient denies any diarrhea. GU: The patient denies any hematuria, dysuria, or frequency. NEUROLOGIC: The patient denies any headache, focal weakness, or seizures. SKIN: The patient denies any lesions or rash. ENDOCRINE: The patient denies polyuria, polyphagia, or polydipsia. MUSCULOSKELETAL: The patient denies any arthritis, joint effusion, or swelling. HEMATOLOGICAL: The patient denies any easy bleeding or bruises.   PHYSICAL EXAMINATION:   VITAL SIGNS: Temperature 96, heart rate 103, respirations 22, blood pressure 123/55, oxygen saturation 93%.   HEENT: Atraumatic, normocephalic. Pupils equal, round, and reactive to light and accommodation. Extraocular movements intact. Sclerae anicteric. Mucous membranes moist.   NECK: Supple. No organomegaly.   CARDIOVASCULAR: S1, S2, regular rate, rhythm. No gallops, no thrills, no murmurs.   RESPIRATORY: The patient has crackles in both lung fields. There are faint end expiratory wheezes bilaterally.   GI: Abdomen is soft, nontender, nondistended. Normal bowel sounds. No hepatosplenomegaly.   GU: There is no hematuria or masses.   SKIN: No lesions, no rash.   ENDOCRINE: No masses. No thyromegaly noted.   LYMPH: No lymphadenopathy or nodes palpable.   NEUROLOGICAL: Cranial nerves II through XII grossly intact. Motor strength is 5 out of 5 bilateral upper and lower extremities. Sensation within normal limits. No focal neurological  deficits noted on examination.   MUSCULOSKELETAL: No arthritis, joint effusion, or swelling.   HEMATOLOGICAL: No ecchymosis, no bleeding, no petechiae.   EXTREMITIES: No cyanosis, no clubbing, no edema. 2+ pedal pulses noted bilaterally.    LABORATORY, DIAGNOSTIC, AND RADIOLOGICAL DATA: EKG sinus tachycardia at 112 beats per minute, premature atrial complexes, right bundle branch, left anterior fascicular block. Chest x-ray shows CHF.    Total CK 33, CK-MB 1.1. WBC count 11,800, hemoglobin 12.6, hematocrit 39.6, platelet count 282, MCV 91, glucose 140, BUN 20, creatinine 0.7, sodium 130, potassium 3.9, chloride 90, CO2 32, calcium 8.9, total bilirubin 0.3, alkaline phosphatase 92, ALT 18, AST 22, total protein 7.5, albumin 3.2, estimated GFR greater than 60. BNP 1860. D-dimer 0.54.   ASSESSMENT AND PLAN:  1. The patient is an 81 year old female who presents with chief complaint of shortness of breath and acute systolic CHF. Admit to telemetry. Start patient on CHF clinical pathway orders. IV Lasix. No beta-blockers due to wheezing. Start low dose Altace. Check serial cardiac enzymes, troponin, echo. Cardiology consultation. 2. Acute chronic obstructive pulmonary disease exacerbation. Start IV Solu-Medrol, nebs, and Rocephin. 3. Hypertension. Continue lisinopril. 4. Hypothyroidism. Continue levothyroxine.  5. Depression. Continue paroxetine.   ____________________________ Donia Ast, MD jsp:drc D: 05/14/2011 02:43:00 ET T: 05/14/2011 09:37:42 ET JOB#: [id number]  cc: Donia Ast, MD, <Dictator> Ginette Pitman. Dan Humphreys, MD  Donia Ast MD ELECTRONICALLY SIGNED 05/14/2011 21:46
